# Patient Record
Sex: Male | Born: 1965 | Race: Black or African American | Hispanic: No | Smoking: Current some day smoker
Health system: Southern US, Community
[De-identification: ages and names within clinical notes are randomized; demographics above are authoritative.]

## PROBLEM LIST (undated history)

## (undated) DIAGNOSIS — E669 Obesity, unspecified: Secondary | ICD-10-CM

## (undated) DIAGNOSIS — E785 Hyperlipidemia, unspecified: Secondary | ICD-10-CM

## (undated) DIAGNOSIS — E876 Hypokalemia: Secondary | ICD-10-CM

## (undated) DIAGNOSIS — I10 Essential (primary) hypertension: Secondary | ICD-10-CM

## (undated) DIAGNOSIS — R7309 Other abnormal glucose: Secondary | ICD-10-CM

## (undated) DIAGNOSIS — L0293 Carbuncle, unspecified: Secondary | ICD-10-CM

## (undated) HISTORY — DX: Essential (primary) hypertension: I10

## (undated) HISTORY — DX: Other abnormal glucose: R73.09

## (undated) HISTORY — DX: Obesity, unspecified: E66.9

## (undated) HISTORY — PX: OTHER SURGICAL HISTORY: SHX169

## (undated) HISTORY — DX: Hypokalemia: E87.6

## (undated) HISTORY — DX: Carbuncle, unspecified: L02.93

## (undated) HISTORY — DX: Hyperlipidemia, unspecified: E78.5

---

## 2004-06-06 ENCOUNTER — Encounter: Payer: Self-pay | Admitting: Internal Medicine

## 2004-06-06 LAB — CONVERTED CEMR LAB: PSA: 0.9 ng/mL

## 2004-07-01 ENCOUNTER — Ambulatory Visit: Payer: Self-pay | Admitting: Internal Medicine

## 2004-07-25 ENCOUNTER — Ambulatory Visit: Payer: Self-pay | Admitting: Endocrinology

## 2004-09-02 ENCOUNTER — Ambulatory Visit: Payer: Self-pay | Admitting: Internal Medicine

## 2004-11-17 ENCOUNTER — Encounter: Admission: RE | Admit: 2004-11-17 | Discharge: 2004-11-17 | Payer: Self-pay | Admitting: Specialist

## 2004-12-05 ENCOUNTER — Ambulatory Visit: Payer: Self-pay | Admitting: Internal Medicine

## 2005-02-26 ENCOUNTER — Ambulatory Visit: Payer: Self-pay | Admitting: Internal Medicine

## 2005-04-17 ENCOUNTER — Ambulatory Visit: Payer: Self-pay | Admitting: Internal Medicine

## 2005-06-09 ENCOUNTER — Emergency Department: Payer: Self-pay | Admitting: Unknown Physician Specialty

## 2005-06-09 ENCOUNTER — Other Ambulatory Visit: Payer: Self-pay

## 2005-06-23 ENCOUNTER — Ambulatory Visit: Payer: Self-pay | Admitting: Internal Medicine

## 2005-07-07 ENCOUNTER — Ambulatory Visit: Payer: Self-pay | Admitting: Internal Medicine

## 2005-07-08 ENCOUNTER — Ambulatory Visit: Payer: Self-pay

## 2006-01-13 ENCOUNTER — Ambulatory Visit: Payer: Self-pay | Admitting: Internal Medicine

## 2006-01-15 ENCOUNTER — Ambulatory Visit: Payer: Self-pay | Admitting: Internal Medicine

## 2006-04-13 ENCOUNTER — Ambulatory Visit: Payer: Self-pay | Admitting: Internal Medicine

## 2006-04-27 ENCOUNTER — Ambulatory Visit: Payer: Self-pay | Admitting: Internal Medicine

## 2006-07-26 ENCOUNTER — Ambulatory Visit: Payer: Self-pay | Admitting: Internal Medicine

## 2006-09-27 ENCOUNTER — Ambulatory Visit: Payer: Self-pay | Admitting: Internal Medicine

## 2006-10-04 ENCOUNTER — Ambulatory Visit: Payer: Self-pay | Admitting: Internal Medicine

## 2006-11-11 ENCOUNTER — Ambulatory Visit: Payer: Self-pay | Admitting: Internal Medicine

## 2007-02-22 ENCOUNTER — Ambulatory Visit: Payer: Self-pay | Admitting: Internal Medicine

## 2007-03-21 ENCOUNTER — Encounter: Payer: Self-pay | Admitting: Internal Medicine

## 2007-03-21 DIAGNOSIS — I10 Essential (primary) hypertension: Secondary | ICD-10-CM

## 2007-03-21 DIAGNOSIS — E785 Hyperlipidemia, unspecified: Secondary | ICD-10-CM

## 2007-03-21 DIAGNOSIS — K219 Gastro-esophageal reflux disease without esophagitis: Secondary | ICD-10-CM | POA: Insufficient documentation

## 2007-03-23 ENCOUNTER — Ambulatory Visit: Payer: Self-pay | Admitting: Cardiology

## 2007-04-11 ENCOUNTER — Ambulatory Visit: Payer: Self-pay

## 2007-04-28 ENCOUNTER — Ambulatory Visit: Payer: Self-pay | Admitting: Cardiology

## 2007-09-23 ENCOUNTER — Telehealth: Payer: Self-pay | Admitting: Internal Medicine

## 2007-09-24 ENCOUNTER — Ambulatory Visit: Payer: Self-pay | Admitting: Family Medicine

## 2007-09-24 DIAGNOSIS — L03319 Cellulitis of trunk, unspecified: Secondary | ICD-10-CM

## 2007-09-24 DIAGNOSIS — L02219 Cutaneous abscess of trunk, unspecified: Secondary | ICD-10-CM

## 2007-09-26 ENCOUNTER — Ambulatory Visit: Payer: Self-pay | Admitting: Internal Medicine

## 2008-09-05 ENCOUNTER — Encounter: Payer: Self-pay | Admitting: Internal Medicine

## 2008-09-25 ENCOUNTER — Ambulatory Visit: Payer: Self-pay | Admitting: Internal Medicine

## 2008-09-25 DIAGNOSIS — F17200 Nicotine dependence, unspecified, uncomplicated: Secondary | ICD-10-CM | POA: Insufficient documentation

## 2008-09-26 ENCOUNTER — Ambulatory Visit: Payer: Self-pay | Admitting: Internal Medicine

## 2008-09-26 LAB — CONVERTED CEMR LAB
BUN: 12 mg/dL (ref 6–23)
CO2: 30 meq/L (ref 19–32)
Calcium: 8.9 mg/dL (ref 8.4–10.5)
Chloride: 107 meq/L (ref 96–112)
Creatinine, Ser: 0.9 mg/dL (ref 0.4–1.5)
GFR calc non Af Amer: 97 mL/min
HDL: 44.3 mg/dL (ref >39.0)
Hgb A1c MFr Bld: 5.7 % (ref 4.6–6.0)
TSH: 0.73 microintl units/mL (ref 0.35–5.50)
VLDL: 16 mg/dL (ref 0–40)

## 2008-10-15 ENCOUNTER — Ambulatory Visit: Payer: Self-pay | Admitting: Internal Medicine

## 2008-10-15 ENCOUNTER — Ambulatory Visit: Payer: Self-pay | Admitting: Cardiology

## 2008-10-15 ENCOUNTER — Observation Stay (HOSPITAL_COMMUNITY): Admission: EM | Admit: 2008-10-15 | Discharge: 2008-10-16 | Payer: Self-pay | Admitting: Emergency Medicine

## 2008-10-15 ENCOUNTER — Ambulatory Visit: Payer: Self-pay | Admitting: Interventional Radiology

## 2008-10-15 DIAGNOSIS — R079 Chest pain, unspecified: Secondary | ICD-10-CM

## 2008-10-16 ENCOUNTER — Encounter: Payer: Self-pay | Admitting: Internal Medicine

## 2008-11-02 ENCOUNTER — Ambulatory Visit: Payer: Self-pay | Admitting: Internal Medicine

## 2008-11-02 DIAGNOSIS — M6282 Rhabdomyolysis: Secondary | ICD-10-CM | POA: Insufficient documentation

## 2008-11-04 ENCOUNTER — Telehealth: Payer: Self-pay | Admitting: Internal Medicine

## 2008-11-15 ENCOUNTER — Encounter (INDEPENDENT_AMBULATORY_CARE_PROVIDER_SITE_OTHER): Payer: Self-pay | Admitting: *Deleted

## 2008-12-28 ENCOUNTER — Ambulatory Visit: Payer: Self-pay | Admitting: Cardiology

## 2009-01-31 ENCOUNTER — Telehealth (INDEPENDENT_AMBULATORY_CARE_PROVIDER_SITE_OTHER): Payer: Self-pay | Admitting: *Deleted

## 2009-05-10 ENCOUNTER — Ambulatory Visit: Payer: Self-pay | Admitting: Internal Medicine

## 2009-05-10 DIAGNOSIS — N453 Epididymo-orchitis: Secondary | ICD-10-CM | POA: Insufficient documentation

## 2009-10-16 ENCOUNTER — Telehealth: Payer: Self-pay | Admitting: Internal Medicine

## 2009-11-13 ENCOUNTER — Ambulatory Visit: Payer: Self-pay | Admitting: Internal Medicine

## 2009-11-13 DIAGNOSIS — R7309 Other abnormal glucose: Secondary | ICD-10-CM

## 2009-11-13 LAB — CONVERTED CEMR LAB
CO2: 24 meq/L (ref 19–32)
Chloride: 103 meq/L (ref 96–112)
Creatinine, Ser: 0.92 mg/dL (ref 0.40–1.50)
Hgb A1c MFr Bld: 5.5 % (ref 4.6–6.1)
Sodium: 138 meq/L (ref 135–145)

## 2009-11-14 ENCOUNTER — Encounter: Payer: Self-pay | Admitting: Internal Medicine

## 2010-02-14 ENCOUNTER — Encounter: Payer: Self-pay | Admitting: Internal Medicine

## 2010-02-14 DIAGNOSIS — N509 Disorder of male genital organs, unspecified: Secondary | ICD-10-CM | POA: Insufficient documentation

## 2010-02-14 LAB — CONVERTED CEMR LAB
Glucose, Urine, Semiquant: NEGATIVE
Ketones, urine, test strip: NEGATIVE
Protein, U semiquant: 100
Urobilinogen, UA: 0.2

## 2010-02-17 ENCOUNTER — Ambulatory Visit: Payer: Self-pay | Admitting: Internal Medicine

## 2010-02-17 LAB — CONVERTED CEMR LAB
Calcium: 9 mg/dL (ref 8.4–10.5)
Creatinine, Ser: 0.9 mg/dL (ref 0.4–1.5)
GFR calc non Af Amer: 115.34 mL/min (ref >60)
Glucose, Bld: 117 mg/dL — ABNORMAL HIGH (ref 70–99)
Potassium: 4.2 meq/L (ref 3.5–5.1)
Sodium: 142 meq/L (ref 135–145)

## 2010-06-03 IMAGING — CR DG CHEST 2V
2 series · 2 of 2 positions shown · non-contrast
Comparison: 11/17/2004

CLINICAL DATA: Chest pain

CHEST - 2 VIEW

[w chest pa]
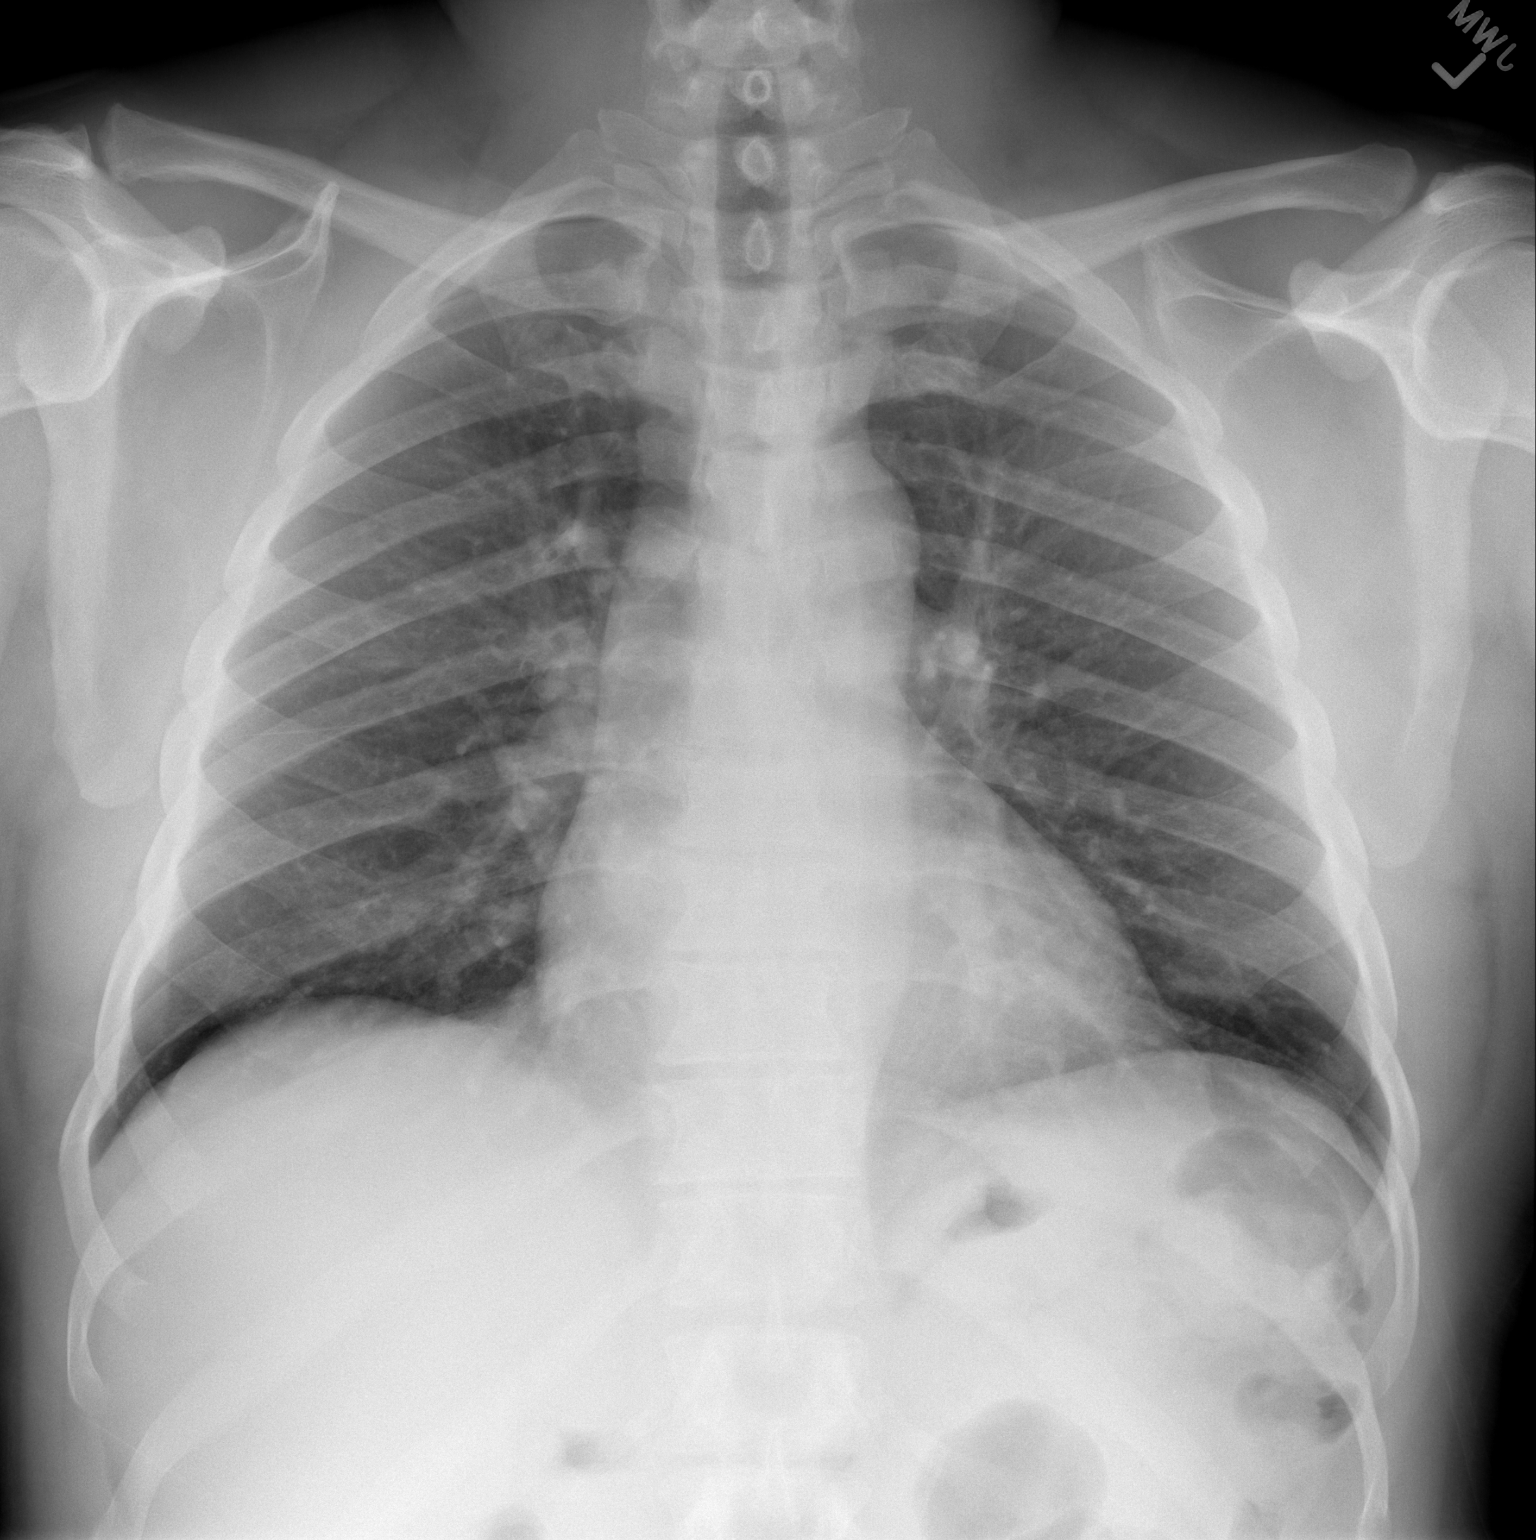

[w chest lat]
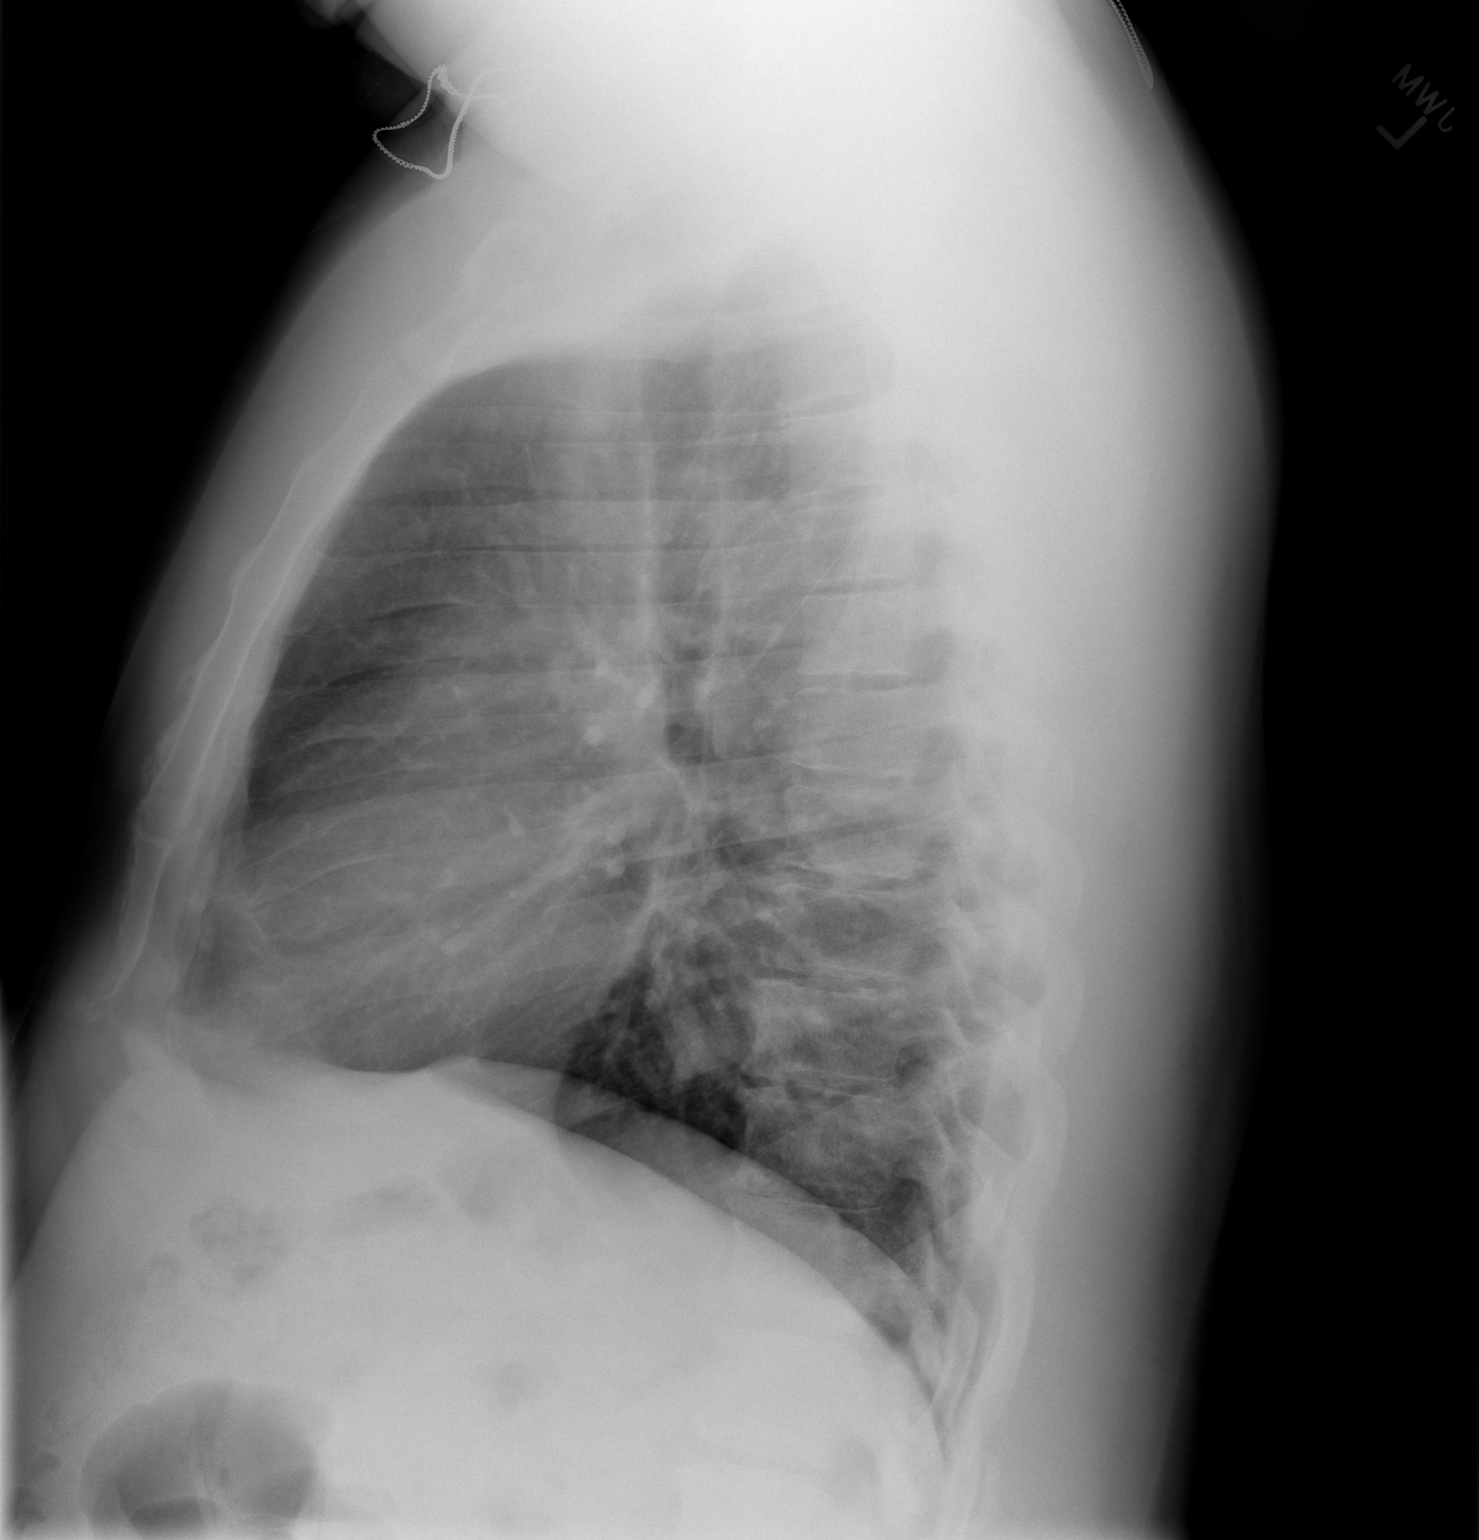

[2 of 2 positions shown; findings below may reference images not displayed]

FINDINGS: The heart size and mediastinal contours are within normal
limits.  Both lungs are clear.  The visualized skeletal structures
are unremarkable.
IMPRESSION: No active cardiopulmonary disease.

## 2010-08-11 ENCOUNTER — Ambulatory Visit: Payer: Self-pay | Admitting: Internal Medicine

## 2010-08-18 ENCOUNTER — Telehealth: Payer: Self-pay | Admitting: Internal Medicine

## 2010-09-28 LAB — CONVERTED CEMR LAB: Total CK: 314 units/L (ref 7–195)

## 2010-10-01 NOTE — Letter (Signed)
   [place] at [facility] [address] [place], [place]  [address]  [place] Phone: [phone]      February 17, 2010   [facility] [address] [place], [place] [address]  RE:  LAB RESULTS  Dear  [name],  The following is an interpretation of your most recent lab tests.  Please take note of any instructions provided or changes to medications that have resulted from your lab work.  ELECTROLYTES:  Good - no changes needed  KIDNEY FUNCTION TESTS:  Good - no changes needed          Sincerely Yours,    Dr. Thomos Lemons

## 2010-10-01 NOTE — Assessment & Plan Note (Signed)
Summary: blood pressure/mhf   Vital Signs:  Patient profile:   45 year old male Height:      70 inches Weight:      266 pounds BMI:     38.30 O2 Sat:      99 % on Room air Temp:     97.9 degrees F oral Pulse rate:   80 / minute Pulse rhythm:   regular Resp:     16 per minute BP sitting:   134 / 80  (right arm) Cuff size:   large  Vitals Entered By: Glendell Docker CMA (February 14, 2010 10:47 AM)  O2 Flow:  Room air CC: Rm 3- Follow up on Blood Pressure Is Patient Diabetic? No   Primary Care Provider:  DThomos Lemons DO  CC:  Rm 3- Follow up on Blood Pressure.  History of Present Illness: 45 y/o African male c/o left side shooting pain to groin area for the past 2 weeks no testicular trauma he has not noticed hernia, no fever or chills.  no urinary symptoms  Htn  - Azor is causing headaches daily .  lasting all day  Preventive Screening-Counseling & Management  Alcohol-Tobacco     Smoking Status: current  Allergies: 1)  ! Lisinopril (Lisinopril)  Past History:  Past Medical History: GERD Hyperlipidemia  Hypertension Tobacco Abuse   Atypical chest pain Myoview 08/08 -  EF 59%  question mild inferior ischemia (suspect bowel activity)      Family History: Family History of CAD Male 1st degree relative <60  Mother deceased with an MI.  Father deceased from   unknown cause.  He has siblings, but he has no idea of their health  status.   All of his family is in [place].       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   Lungs:  normal respiratory effort and normal breath sounds.   Heart:  normal rate, regular rhythm, and no gallop.   Genitalia:  left testicular tenderness,  no mass,  no cutaneous lesions and no urethral discharge.     Impression & Recommendations:  Problem # 1:  TESTICULAR PAIN, LEFT  (ICD-608.9) 45 y/o with left testicular tenderness consistent with epiditymitis.  tx with doxy and ceftin.  Patient advised to call office if symptoms persist or worsen.  Orders: UA Dipstick w/o Micro (manual) ([address])  Problem # 2:  HYPERTENSION (ICD-401.9) headaches with azor.  change to losartan and hctz  The following medications were removed from the medication list:    Azor 5-20 Mg Tabs (Amlodipine-olmesartan) ..... One by mouth once daily His updated medication list for this problem includes:    Losartan Potassium 100 Mg Tabs (Losartan potassium) ..... One by mouth qam    Hydrochlorothiazide 25 Mg Tabs (Hydrochlorothiazide) ..... One by mouth once daily  BP today: 134/80 Prior BP: 140/90 (11/13/2009)  Labs Reviewed: K+: 3.9 (11/13/2009) Creat: : 0.92 (11/13/2009)   Chol: 154 (09/26/2008)   HDL: 44.3 (09/26/2008)   LDL: 94 (09/26/2008)   TG: 81 (09/26/2008)  Complete Medication List: 1)  Doxycycline Hyclate 100 Mg Tabs (Doxycycline hyclate) .... One by mouth two times a day 2)  Losartan Potassium 100 Mg Tabs (Losartan potassium) .... One by mouth qam 3)  Hydrochlorothiazide 25 Mg Tabs (Hydrochlorothiazide) .... One by mouth once daily 4)  Cefuroxime Axetil 500 Mg Tabs (  Cefuroxime axetil) .... One by mouth two times a day  Patient Instructions: 1)  Please schedule a follow-up appointment in 6 months. 2)  BMET:  401.9  3)  schedule blood work within one month 4)  Call our office if your testicular pain does not get better Prescriptions: CEFUROXIME AXETIL 500 MG TABS (CEFUROXIME AXETIL) one by mouth two times a day  #20 x 0   Entered and Authorized by:   [name] DO   Signed by:   [name] DO on 02/14/2010   Method used:   Electronically to        [facility] [address]       [address], [address]       [place], [place]  [address]       Ph: [phone]       Fax: [phone]   RxID:    [id number] HYDROCHLOROTHIAZIDE 25 MG TABS (HYDROCHLOROTHIAZIDE) one by mouth once daily  #30 x 5   Entered and Authorized by:   [name] DO   Signed by:   [name] DO on 02/14/2010   Method used:   Electronically to        [facility] [address] (retail)       [address], [address]       [place], [place]  [address]       Ph: [phone]       Fax: [phone]   RxID:   [id number] LOSARTAN POTASSIUM 100 MG TABS (LOSARTAN POTASSIUM) one by mouth qam  #30 x 5   Entered and Authorized by:   [name] DO   Signed by:   [name] DO on 02/14/2010   Method used:   Electronically to        [facility] [address] ([facility])       [address], [address]       [place], [place]  [address]       Ph: [phone]       Fax: [phone]   RxID:   [id number] DOXYCYCLINE HYCLATE 100 MG TABS (DOXYCYCLINE HYCLATE) one by mouth two times a day  #20 x 0   Entered and Authorized by:   [name] DO   Signed by:   [name] DO on 02/14/2010   Method used:   Electronically to        [facility] [address])       [address], [address]       [place], [place]  [address]       Ph: [phone]       Fax: [phone]   RxID:   [id number]   Laboratory Results   Urine Tests    Routine Urinalysis   Color: orange Appearance: Clear Glucose: negative   (Normal Range: Negative) Bilirubin: negative   (Normal Range: Negative) Ketone: negative   (Normal Range: Negative) Spec. Gravity: >=1.030   (Normal Range: 1.003-1.035) Blood: small   (Normal Range: Negative) pH: 5.0   (Normal Range: 5.0-8.0) Protein: 100   (Normal Range: Negative) Urobilinogen: 0.2   (Normal Range: 0-1) Nitrite: negative   (Normal Range: Negative) Leukocyte Esterace: negative   (Normal Range: Negative)

## 2010-10-01 NOTE — Assessment & Plan Note (Signed)
Summary: BLOOD PRESSURE/MHF   Vital Signs:  Patient profile:   45 year old male Height:      70 inches Weight:      274 pounds BMI:     39.46 O2 Sat:      98 % on Room air Temp:     97.7 degrees F oral Pulse rate:   72 / minute Pulse rhythm:   regular Resp:     16 per minute BP sitting:   140 / 90  (left arm) Cuff size:   large  Vitals Entered By: Glendell Docker CMA (November 13, 2009 11:21 AM)  O2 Flow:  Room air CC: Rm 2- Follow up On Blood Pressure   Primary Care Provider:  Dondra Spry DO  CC:  Rm 2- Follow up On Blood Pressure.  History of Present Illness:  Hypertension Follow-Up      This is a 45 year old man who presents for Hypertension follow-up.  The patient denies lightheadedness and edema.  The patient denies the following associated symptoms: chest pain and chest pressure.  Compliance with medications (by patient report) has been near 100%.  The patient reports that dietary compliance has been fair and poor.  The patient reports no exercise.    Allergies: 1)  ! Lisinopril (Lisinopril)  Past History:  Past Medical History: GERD Hyperlipidemia  Hypertension Tobacco Abuse  Atypical chest pain Myoview 08/08 -  EF 59%  question mild inferior ischemia (suspect bowel activity)      Family History: Family History of CAD Male 1st degree relative <60  Mother deceased with an MI.  Father deceased from   unknown cause.  He has siblings, but he has no idea of their health  status.   All of his family is in [place].      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   Neck:  No deformities, masses, or tenderness noted.no carotid bruits.   Lungs:  normal respiratory effort and normal breath sounds.   Heart:  normal rate, regular rhythm, and no gallop.   Abdomen:  soft, non-tender, and normal bowel sounds.   Extremities:   trace left pedal edema and trace right pedal edema.     Impression & Recommendations:  Problem # 1:  HYPERTENSION (ICD-401.9) Pt c/o urinary freq.  He is truck Hospital doctor.  switch to azor.  The following medications were removed from the medication list:    Diovan Hct 160-25 Mg Tabs (Valsartan-hydrochlorothiazide) .Marland Kitchen... Take 1 tablet by mouth once a day His updated medication list for this problem includes:    Azor 5-20 Mg Tabs (Amlodipine-olmesartan) ..... One by mouth once daily  Orders: T-Basic Metabolic Panel [phone]) T- Hemoglobin A1C ([address]14[address])  BP today: 140/90 Prior BP: 138/80 (05/10/2009)  Labs Reviewed: K+: 3.8 (09/26/2008) Creat: : 0.9 (09/26/2008)   Chol: 154 (09/26/2008)   HDL: 44.3 (09/26/2008)   LDL: 94 (09/26/2008)   TG: 81 (09/26/2008)  Problem # 2:  HYPERGLYCEMIA (ICD-790.29) monitor a1c.  pt counseled on diet and exercise Labs Reviewed: Creat: 0.9 (09/26/2008)     Complete Medication List: 1)  Azor 5-20 Mg Tabs (Amlodipine-olmesartan) .... One by mouth once daily  Patient Instructions: 1)  Please schedule a follow-up appointment in 6 months. Prescriptions: AZOR 5-20 MG TABS (AMLODIPINE-OLMESARTAN) one by mouth once daily  #90 x 3   Entered and Authorized by:  Dondra Spry DO   Signed by:   [name] DO on 11/13/2009   Method used:   Print then Give to Patient   RxID:   [id number]   Current Allergies (reviewed today): ! LISINOPRIL (LISINOPRIL)

## 2010-10-01 NOTE — Progress Notes (Signed)
Summary: Out of State Diovan Refill   Phone Note Refill Request Message from:  Pharmacy on October 16, 2009 2:45 PM  Refills Requested: Medication #1:  DIOVAN HCT 160-25 MG  TABS Take 1 tablet by mouth once a day   Dosage confirmed as above?Dosage Confirmed   Brand Name Necessary? No   Supply Requested: 1 month [facility] phone # [phone] fax # [phone]   Method Requested: Electronic Next Appointment Scheduled: none Initial call taken by: Roselle Locus,  October 16, 2009 2:46 PM  Follow-up for Phone Call        ok to send refill x 2 Follow-up by: [name] DO,  October 16, 2009 6:37 PM  Additional Follow-up for Phone Call Additional follow up Details #1::        Rx called to pharmacy Additional Follow-up by: Glendell Docker CMA,  October 17, 2009 3:24 PM

## 2010-10-01 NOTE — Letter (Signed)
   [place] at [facility] [address] [place], [place]  [address]  [place] Phone: [phone]      November 14, 2009   [facility] [address] [place], [place] [address]  RE:  LAB RESULTS  Dear  Mr. ADELSON,  The following is an interpretation of your most recent lab tests.  Please take note of any instructions provided or changes to medications that have resulted from your lab work.  ELECTROLYTES:  Good - no changes needed  KIDNEY FUNCTION TESTS:  Good - no changes needed     Diabetes blood test  - normal       Sincerely Yours,    Dr. Thomos Lemons

## 2010-10-02 NOTE — Progress Notes (Signed)
Summary: Medication Refill  Phone Note Call from Patient Call back at [phone]   Caller: Patient Call For: [name] DO Summary of Call: patient caled requesting a refill on his blood pressure medication. He states that he will be leaving for [place] this morning.  Per Dr Artist Pais approval patient was informed a 90 supply of his medication has been sent to [facility]. He waas informed he would need to return for blood work and follow up office visit with Dr Artist Pais. Patient verbalized understanding and agrees. Initial call taken by: Glendell Docker CMA,  August 18, 2010 8:11 AM    Prescriptions: HYDROCHLOROTHIAZIDE 25 MG TABS (HYDROCHLOROTHIAZIDE) one by mouth once daily  #90 x 0   Entered by:   Glendell Docker CMA   Authorized by:   [name] DO   Signed by:   Glendell Docker CMA on 08/18/2010   Method used:   Electronically to        [facility] [address])       [address], [address]       [place], [place]  [address]       Ph: [phone]       Fax: [phone]   RxID:   [id number] LOSARTAN POTASSIUM 100 MG TABS (LOSARTAN POTASSIUM) one by mouth qam  #90 x 0   Entered by:   Glendell Docker CMA   Authorized by:   [name] DO   Signed by:   Glendell Docker CMA on 08/18/2010   Method used:   Electronically to        [facility] [address]       [address], [address]       [place], [place]  [address]       Ph: [phone]       Fax: [phone]   RxID:   [id number]

## 2010-11-04 ENCOUNTER — Telehealth: Payer: Self-pay | Admitting: Internal Medicine

## 2010-11-05 ENCOUNTER — Telehealth: Payer: Self-pay | Admitting: Internal Medicine

## 2010-11-11 NOTE — Progress Notes (Signed)
Summary: refill-hydrochlorothi and losartan  Phone Note Refill Request Message from:  Fax from Pharmacy on November 05, 2010 9:47 AM  Refills Requested: Medication #1:  losartan 100mg  take one tablet by mouth in the morning   Brand Name Necessary? No   Supply Requested: 3 months   Last Refilled: 08/18/2010  Medication #2:  HYDROCHLOROTHIAZIDE 25 MG TABS one by mouth once daily   Dosage confirmed as above?Dosage Confirmed   Brand Name Necessary? No   Supply Requested: 3 months   Last Refilled: 08/18/[facility] [address] Chrissie Noa [place], [place] [address] fax [phone]   Method Requested: Electronic Next Appointment Scheduled: 3.15.12 Siyona Coto Initial call taken by: Elba Barman,  November 05, 2010 9:49 AM  Follow-up for Phone Call        Printed rx from 11/04/2010 have been faxed to  [phone] Follow-up by: Glendell Docker CMA,  November 05, 2010 10:14 AM

## 2010-11-11 NOTE — Progress Notes (Signed)
Summary: Blood Pressure Medication  Phone Note Call from Patient Call back at [phone]   Caller: Patient Call For: [name] DO Summary of Call: patient called and left voice message requesting refill on Blood pressure medication. He states that he is out  Initial call taken by: Glendell Docker CMA,  November 04, 2010 4:10 PM  Follow-up for Phone Call        Pt called back    pharmacy [facility] ,[facility], I spoke with pharmacist ,he will fax refill request  Follow-up by: Darral Dash,  November 04, 2010 4:57 PM  Additional Follow-up for Phone Call Additional follow up Details #1::        Spoke to Gray at [place], [place] [phone] and gave verbal auth for Losartan Potassium and HCTZ for # 30 on each med x no refills.  Pt notified. Nicki Guadalajara Fergerson CMA Duncan Dull)  November 04, 2010 5:18 PM     Prescriptions: HYDROCHLOROTHIAZIDE 25 MG TABS (HYDROCHLOROTHIAZIDE) one by mouth once daily  #30 x 0   Entered by:   Mervin Kung CMA (AAMA)   Authorized by:   [name] DO   Signed by:   Mervin Kung CMA (AAMA) on 11/04/2010   Method used:   Print then Give to Patient   RxID:   [id number] LOSARTAN POTASSIUM 100 MG TABS (LOSARTAN POTASSIUM) one by mouth qam  #30 x 0   Entered by:   Mervin Kung CMA (AAMA)   Authorized by:   [name] DO   Signed by:   Mervin Kung CMA (AAMA) on 11/04/2010   Method used:   Print then Give to Patient   RxID:   [id number]

## 2010-11-13 ENCOUNTER — Encounter: Payer: Self-pay | Admitting: Internal Medicine

## 2010-11-13 ENCOUNTER — Ambulatory Visit (INDEPENDENT_AMBULATORY_CARE_PROVIDER_SITE_OTHER): Payer: PRIVATE HEALTH INSURANCE | Admitting: Internal Medicine

## 2010-11-13 DIAGNOSIS — R7309 Other abnormal glucose: Secondary | ICD-10-CM

## 2010-11-13 DIAGNOSIS — I10 Essential (primary) hypertension: Secondary | ICD-10-CM

## 2010-12-02 NOTE — Assessment & Plan Note (Signed)
Summary: MED REFILL/MHF   Vital Signs:  Patient profile:   45 year old male Height:      70 inches Weight:      271.75 pounds BMI:     39.13 O2 Sat:      97 % on Room air Temp:     97.9 degrees F oral Pulse rate:   88 / minute Resp:     18 per minute BP sitting:   134 / 80  (left arm) Cuff size:   large  Vitals Entered By: Glendell Docker CMA (November 13, 2010 3:52 PM)  O2 Flow:  Room air CC: Medication follow up  Is Patient Diabetic? No Pain Assessment Patient in pain? no      Comments will be traveling to [place] in May has questions  about travel vaccines   Primary Care Provider:  [name] DO  CC:  Medication follow up .  History of Present Illness:  Hypertension Follow-Up      This is a 45 year old man who presents for Hypertension follow-up.  The patient reports edema, but denies lightheadedness and impotence.  The patient denies the following associated symptoms: chest pain.  Compliance with medications (by patient report) has been near 100%.  The patient reports that dietary compliance has been fair.    Preventive Screening-Counseling & Management  Alcohol-Tobacco     Smoking Status: current     Cigars/week: 1  Allergies: 1)  ! 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   Neck:  No deformities, masses, or tenderness noted.no carotid bruits.   Lungs:  normal respiratory effort and normal breath sounds.   Heart:  normal rate, regular rhythm, and no gallop.   Extremities:  trace left pedal edema and trace right pedal edema.     Impression &  Recommendations:  Problem # 1:  HYPERTENSION (ICD-401.9) Assessment Unchanged  His updated medication list for this problem includes:    Losartan Potassium 100 Mg Tabs (Losartan potassium) ..... One by mouth qam    Hydrochlorothiazide 25 Mg Tabs (Hydrochlorothiazide) ..... One by mouth once daily  Orders: T-Basic Metabolic Panel [phone])  BP today: 134/80 Prior BP: 134/80 (02/14/2010)  Labs Reviewed: K+: 4.2 (02/17/2010) Creat: : 0.9 (02/17/2010)   Chol: 154 (09/26/2008)   HDL: 44.3 (09/26/2008)   LDL: 94 (09/26/2008)   TG: 81 (09/26/2008)  Problem # 2:  HYPERGLYCEMIA (ICD-790.29)  Labs Reviewed: Creat: 0.9 (02/17/2010)     Complete Medication List: 1)  Losartan Potassium 100 Mg Tabs (Losartan potassium) .... One by mouth qam 2)  Hydrochlorothiazide 25 Mg Tabs (Hydrochlorothiazide) .... One by mouth once daily  Patient Instructions: 1)  Please schedule a follow-up appointment in 1 year. Prescriptions: HYDROCHLOROTHIAZIDE 25 MG TABS (HYDROCHLOROTHIAZIDE) one by mouth once daily  #90 x 3   Entered and Authorized by:   [name] DO   Signed by:   [name] DO on 11/13/2010   Method used:   Print then Give to Patient   RxID:   [id number] LOSARTAN POTASSIUM 100 MG TABS (LOSARTAN POTASSIUM) one by mouth qam  #90 x  3   Entered and Authorized by:   [name] DO   Signed by:   [name] DO on 11/13/2010   Method used:   Print then Give to Patient   RxID:   [id number]    Orders Added: 1)  T-Basic Metabolic Panel [[id number]] 2)  Est. Patient Level III [33295]    Current Allergies (reviewed today): ! LISINOPRIL (LISINOPRIL)

## 2010-12-16 LAB — PROTIME-INR: Prothrombin Time: 12.7 seconds (ref 11.6–15.2)

## 2010-12-16 LAB — CBC
Hemoglobin: 14.4 g/dL (ref 13.0–17.0)
MCHC: 33.5 g/dL (ref 30.0–36.0)
MCV: 83.9 fL (ref 78.0–100.0)
Platelets: 256 10*3/uL (ref 150–400)
RBC: 5.12 MIL/uL (ref 4.22–5.81)
RDW: 13.3 % (ref 11.5–15.5)
WBC: 5.3 10*3/uL (ref 4.0–10.5)

## 2010-12-16 LAB — BASIC METABOLIC PANEL
Calcium: 9.1 mg/dL (ref 8.4–10.5)
Chloride: 103 mEq/L (ref 96–112)
GFR calc non Af Amer: >60 mL/min (ref >60)
Potassium: 3.9 mEq/L (ref 3.5–5.1)

## 2010-12-16 LAB — CARDIAC PANEL(CRET KIN+CKTOT+MB+TROPI)
CK, MB: 2.9 ng/mL (ref 0.3–4.0)
CK, MB: 3.2 ng/mL (ref 0.3–4.0)
Relative Index: 0.6 (ref 0.0–2.5)
Relative Index: 0.6 (ref 0.0–2.5)
Troponin I: <0.01 ng/mL (ref 0.00–0.06)

## 2010-12-16 LAB — DIFFERENTIAL
Eosinophils Absolute: 0.1 10*3/uL (ref 0.0–0.7)
Lymphs Abs: 1.9 10*3/uL (ref 0.7–4.0)

## 2010-12-16 LAB — TROPONIN I: Troponin I: <0.01 ng/mL (ref 0.00–0.06)

## 2010-12-16 LAB — CK TOTAL AND CKMB (NOT AT ARMC): Relative Index: 0.7 (ref 0.0–2.5)

## 2010-12-16 LAB — D-DIMER, QUANTITATIVE: D-Dimer, Quant: 0.59 ug/mL-FEU — ABNORMAL HIGH (ref 0.00–0.48)

## 2011-01-13 NOTE — Assessment & Plan Note (Signed)
[facility]                            CARDIOLOGY OFFICE NOTE   RECTOR, DEVONSHIRE                          MRN:          [id number]  DATE:03/23/2007                            DOB:          1966/06/06    Mr. Crotty is a 45 year old male who I am asked to consult on concerning  chest pain. He has no prior cardiac history by his report. Note his  English is not great. He did have a Myoview performed on July 08, 2005. At that time the patient exercised for a duration of 9 minutes and  there were no ST changes. His perfusion images were felt to be normal.  The patient states that he occasionally has pain in his left shoulder  and arm area. This is predominantly with use. It is not pleuritic, or  positional, nor is it related to food. It is not exertional. There is no  associated nausea, vomiting, shortness of breath, or diaphoresis. He has  had this intermittently for 3 months. Note he does not have exertional  chest pain. He denies any dyspnea on exertion, orthopnea, PND, pedal  edema, or syncope.   His medications include;  1. Diovan/hydrochlorothiazide 160/25 mg tablets 1 p.o. daily.  2. Zocor 40 mg p.o. daily.   HE HAS AN INTOLERANCE TO ACE INHIBITOR SECONDARY TO COUGHING.   FAMILY HISTORY:  Positive for coronary artery disease as his mother had  a myocardial infarction by his report at age 68.   SOCIAL HISTORY:  He smokes cigars. He occasionally consumes alcohol.   PAST MEDICAL HISTORY:  Significant for hypertension, hyperlipidemia,  there is no diabetes mellitus. He has a history of peptic ulcer disease.  He also has a history of lower back pain. He has had no previous  surgeries.   REVIEW OF SYSTEMS:  He denies any headaches, fevers, chills. There is an  occasional cough but it is nonproductive and there is no hemoptysis.  There is no dysphagia, odynophagia, melena, or hematochezia. There is no  dysuria, hematuria. Denies any seizure  activity. There is no orthopnea,  PND, or pedal edema. There is no claudication noted. The remainder of  the systems are negative.   PHYSICAL EXAMINATION:  Today shows a blood pressure of 140/92 and his  pulse 88. He weight 276 pounds. He is well-developed, somewhat obese. He  is in no acute distress.  SKIN: Warm and dry.  He does not appear to be depressed.  There is no peripheral clubbing.  His back is normal.  HEENT: Normal with normal eyelids.  NECK: Supple with a normal upstroke bilaterally.  There are no bruits  noted. There is no jugular venous distension and no thyromegaly is  noted.  CHEST: Clear to auscultation, normal expansion.  CARDIOVASCULAR EXAM: Reveals a regular rate and rhythm. Normal S1, S2.  There are no murmurs, rubs, or gallops noted.  ABDOMINAL EXAM: Not tender or distended.  Positive bowel sounds. No  hepatosplenomegaly. No masses appreciated. There is no abdominal bruits.  He has 2 + femoral pulses bilaterally. No bruits.  EXTREMITIES: Show  no edema and I could palpate no cords. He has 2 +  posterior tibial pulses bilaterally. He has some scarring on his left  lower extremity from an accident as a child.  NEUROLOGICAL EXAM: Grossly intact.   Electrocardiogram shows a sinus rhythm at a rate of 83. 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 A prior septal infarct can not be excluded.  There is left ventricular hypertrophy. 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   DIAGNOSES:  1. Atypical chest pain- the patient's symptoms are very atypical and      sound to possibly be musculoskeletal in etiology. However, he has      hypertension, hyperlipidemia, and a strong family history. His      electrocardiogram also shows a question of a prior inferior and      septal infarct. We will plan to check an echocardiogram to quantify      his left ventricular function and wall motion. We will also      schedule him to have a stress Myoview. If these are  unremarkable      then we will not pursue further cardiac workup. If they are      abnormal then he may require cardiac catheterization.  2. Abnormal electrocardiogram- as per #1.  3. Hypertension- his blood pressure is mildly elevated today. This      will need to be tracked and his Diovan could be increased as      needed.  4. Hyperlipidemia- Dr. Artist Pais is following this.  5. Tobacco abuse- the patient needs to discontinue his smoking.   We will see him back on an as needed basis pending the results of his  echocardiogram and Myoview.     Madolyn Frieze Jens Som, MD, [facility]  Electronically Signed    BSC/MedQ  DD: 03/23/2007  DT: 03/23/2007  Job #: [id number]   cc:   Barbette Hair. Artist Pais, DO

## 2011-01-13 NOTE — Assessment & Plan Note (Signed)
[facility]                            CARDIOLOGY OFFICE NOTE   [name], [name]                          MRN:          [id number]  DATE:04/28/2007                            DOB:          [birth year]    [name] is a 45 year old gentleman with hypertension and  hyperlipidemia who I was recently asked to evaluate for atypical chest  pain.  We scheduled an echocardiogram on April 11, 2007.  His LV  function was normal.  There was trivial mitral regurgitation.  There was  trivial tricuspid regurgitation.  We also scheduled a Myoview which was  performed on April 11, 2007.  The patient's ejection fraction was 59%.  There was bowel activity making the inferior wall difficult to evaluate  but there appeared to be mild ischemia in the inferior septal wall.  It  was felt to be a low risk study.  Note, the patient did exercise for 9  minutes and there were no ST changes.  Since that time he has had no  further chest pain and there is no dyspnea.   MEDICATIONS:  1. Diovan HCT 160/25 mg tablets one p.o. daily.  2. Zocor 40 mg p.o. daily.   PHYSICAL EXAMINATION:  Shows a blood pressure of 142/87 and his pulse is  88.  HEENT:  Normal.  NECK:  Supple.  CHEST:  Clear.  CARDIOVASCULAR:  Regular.  ABDOMINAL:  Benign.  EXTREMITIES:  Show no edema.   DIAGNOSES:  1. Mildly abnormal nuclear study -- I have reviewed the images with      [name].  I think his previous chest pain was atypical and      unlikely to be cardiac.  His stress Myoview appears to be low risk      and he has had no symptoms since I saw him previously.  We will      therefore plan medical therapy and plan to repeat his Myoview in 1      year.  We could certainly re-assess this sooner if he develops      recurrent symptoms.  He understands this and is agreeable.  We will      continue with his statin and his angiotensin reception blocker but      we will also add aspirin 81 mg p.o.  daily.  2. Hypertension -- His blood pressure is mildly elevated today he will      follow up with Dr. Artist Pais concerning this issue.  3. Hyperlipidemia -- Dr. Artist Pais is following this as well.  4. Tobacco abuse -- We discussed the importance of discontinuing this.      Will see him back in 6 months.    Madolyn Frieze Jens Som, MD, [facility]  Electronically Signed   BSC/MedQ  DD: 04/28/2007  DT: 04/29/2007  Job #: [id number]   cc:   Barbette Hair. Artist Pais, DO

## 2011-01-13 NOTE — Consult Note (Signed)
NAMESIMS, [name]                 ACCOUNT NO.:  [id number]   MEDICAL RECORD NO.:  [id number]          PATIENT TYPE:  OBV   LOCATION:  4710                         FACILITY:  [facility]   PHYSICIAN:  Thomas C. Wall, MD, FACCDATE OF BIRTH:  Mar 19, 1966   DATE OF CONSULTATION:  10/15/2008  DATE OF DISCHARGE:                                 CONSULTATION   PRIMARY CARDIOLOGIST:  Madolyn Frieze. Jens Som, MD, [facility].   PRIMARY CARE PHYSICIAN:  Barbette Hair. Artist Pais, DO   REASON FOR CONSULTATION:  Chest pain.   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  He discussed that he has a  pinching pain on the left side of the chest lasting seconds occurring  once a day.  Yesterday, however, it occurred three times.  It was  described as a quick pinch; however, the patient yesterday felt also  some gas in his chest, which he feels chronically that it occurred with  the pinching.  He saw Dr. Artist Pais and also complained of some pain radiating  down to the left arm from the shoulder to the elbow occurring yesterday  as well.  The patient had no other symptoms after being seen at Dr.  Olegario Messier office with multiple cardiovascular risk factors that you felt, it  would be best to have him admitted to have further evaluation.  Of note,  the patient was seen by Dr. Olga Millers and a stress Myoview was done  in August 2008 revealing mild inferior septal wall ischemia.  However,  he also had some bowel attenuation and it was deemed a low-risk study at  that time.   REVIEW OF SYSTEMS:  Positive for chest pain described as pinching pain  and achiness in the left arm radiating to the elbow and some gas-like  pain, which he has chronically.  All other systems are reviewed and  found to be negative.   PAST MEDICAL HISTORY:  1. Hypertension.  2. Hyperlipidemia.  3. Tobacco abuse.  4.  Peptic ulcer disease.  5. Chronic lower back pain.   PAST SURGICAL HISTORY:  None.   SOCIAL HISTORY:  The patient lives in [place].  He is a widower.  He  lives alone.  He is a Naval architect.  He has two children who live with  other family.  He smokes cigars and drinks alcohol occasionally.  No  drug use.   FAMILY HISTORY:  Mother deceased with an MI.  Father deceased from  unknown cause.  He has siblings, but he has no idea of their health  status.  All of his family is in [place].   CURRENT MEDICATIONS:  1. Diovan 160/25 mg daily.  2. Zocor 40 mg nightly.  3. Protonix 40 mg daily.  4. Aspirin 81 mg daily.   ALLERGIES:  No known drug allergies.   CURRENT LABORATORIES:  Sodium 138, potassium 3.9, chloride 103, CO2 of  27, BUN 13, creatinine 0.90, glucose 103.  Hemoglobin 14.4, hematocrit  43.0, white blood cells 5.3, platelets 256.  CK 624, MB 4.3, troponin  less than 0.01.  PT 12.7, INR 0.9.  Chest x-ray revealing no active  cardiopulmonary disease.  EKG reveals normal sinus rhythm and anterior  fascicular block, rate of 84 beats per minute.  No change from February  2008.   CARDIOVASCULAR RISK FACTORS:  Hypertension, hypercholesterolemia, family  history of tobacco use and obesity.   PHYSICAL EXAMINATION:  VITAL SIGNS:  Blood pressure 138/62, pulse 70,  respirations 18, temperature 97.9.  The patient weighs 266 pounds.  HEENT:  Head is normocephalic and atraumatic.  Eyes:  PERRLA.  Mucous  membranes of mouth pink and moist.  Tongue is midline.  NECK:  Supple.  There is no JVD.  There are no carotid bruits  appreciated.  CARDIOVASCULAR:  Regular rate and rhythm without murmurs, rubs, or  gallops.  Pulses are 2+ and equal without bruits.  LUNGS:  Clear to auscultation without wheezes, rales, or rhonchi.  ABDOMEN:  Soft and nontender with normal bowel sounds.  EXTREMITIES:  Without clubbing, cyanosis, or edema.  Pulses are 2+ and  equal bilaterally.  NEUROLOGIC:  Cranial  nerves II through XII are grossly intact.   IMPRESSION:  1. Chest pain with typical and atypical features described as pinching      with achiness in the left arm x1 week.  2. Hypertension.  3. Hypercholesterolemia.   PLAN:  A 45 year old African American male who presented to Dr. Olegario Messier  office with complaints of 1 week of pinching chest pain, occurring once  a day with the exception of yesterday having three episodes with left  arm pain and gas pain in his chest.  CK plus MB were mildly elevated.  Troponin is negative.  EKG is unchanged.  The patient has been seen and  examined by myself and Dr. Juanito Doom.  He does have multiple  cardiovascular risk factors, but the chest pain is atypical.  He is  nondiabetic.  Our plan will be to check cardiac enzymes as Dr. Artist Pais has  already ordered and continue medications.  If cardiac enzymes are  negative, we would repeat stress Myoview.      Bettey Mare. Lyman Bishop, NP      Jesse Sans. Daleen Squibb, MD, [facility]  Electronically Signed    KML/MEDQ  D:  10/15/2008  T:  10/16/2008  Job:  [id number]   cc:   Barbette Hair. Artist Pais, DO

## 2011-01-16 NOTE — Discharge Summary (Signed)
[name], [name]                 ACCOUNT NO.:  [id number]   MEDICAL RECORD NO.:  [id number]          PATIENT TYPE:  OBV   LOCATION:  4710                         FACILITY:  [facility]   PHYSICIAN:  Valerie A. Felicity Coyer, MDDATE OF BIRTH:  [birth year]   DATE OF ADMISSION:  10/15/2008  DATE OF DISCHARGE:  10/16/2008                               DISCHARGE SUMMARY   DISCHARGE DIAGNOSES:  1. Chest pain, atypical for angina, status post cardiology evaluation,      rule out acute coronary syndrome negative, rule out pulmonary      embolism negative, stable for discharge home to perform outpatient      Myoview at the direction of cardiology.  2. Hypertension, well controlled, continue home medications.  3. Dyslipidemia, holding statin due to elevated CK, outpatient      followup with primary MD.  4. Chronic low back pain, question right lower extremity      radiculopathy, outpatient followup with primary care physician.   DISCHARGE MEDICATIONS:  The patient to hold Zocor until follow up with  Dr. Artist Pais.  Other medications as before which include,  1. Diovan HCT 160/25 once p.o. daily.  2. Protonix 40 mg daily.  3. Aspirin 81 mg once daily.   HOSPITAL FOLLOWUP:  Dr. Thomos Lemons for Thursday, October 25, 2008, at  11:30 a.m., follow up on these chronic issues as well as other medical  concerns.   CONSULTS THIS HOSPITALIZATION:  [facility], Dr. Daleen Squibb and Dr.  Myrtis Ser on October 15, 2008.   PROCEDURES:  A 2-D echo with normal LV function as well as CT of the  chest rule out PE negative.   HOSPITAL COURSE BY PROBLEM:  Chest pain.  The patient is a 45 year old  gentleman from [place], who presented to his primary care physician  with complaints of chest pain radiating down left arm, due to history of  hypertension and dyslipidemia who is in the profession of truck driving.  He was admitted for further evaluation to rule out cardiac abnormality.  On telemetry, there were no arrhythmias.   Serial cardiac enzymes were  negative, and because of an elevated D-dimer, a CT chest was performed  which ruled out PE.  The patient had no further exacerbation of pain  symptoms during this hospitalization, and after cardiology evaluation,  it was felt that his pain was unlikely cardiac in nature.  It felt to be  radicular.  He should have outpatient followup as appropriate, but  otherwise is to continue therapy for reflux and musculoskeletal therapy.  Because of an elevated CK in the 100s, his Zocor was held, but there was  no specific evidence of rhabdo.  Recommend resuming statin for control  of his dyslipidemia as is felt appropriate by his primary MD, pending  followup.     Valerie A. Felicity Coyer, MD  Electronically Signed    VAL/MEDQ  D:  11/08/2008  T:  11/09/2008  Job:  [id number]

## 2011-04-24 ENCOUNTER — Encounter: Payer: Self-pay | Admitting: Family

## 2011-04-24 ENCOUNTER — Ambulatory Visit (INDEPENDENT_AMBULATORY_CARE_PROVIDER_SITE_OTHER): Payer: PRIVATE HEALTH INSURANCE | Admitting: Family

## 2011-04-24 VITALS — BP 130/96 | HR 84 | Temp 98.7°F | Resp 16 | Ht 70.0 in | Wt 273.1 lb

## 2011-04-24 DIAGNOSIS — S239XXA Sprain of unspecified parts of thorax, initial encounter: Secondary | ICD-10-CM

## 2011-04-24 DIAGNOSIS — I10 Essential (primary) hypertension: Secondary | ICD-10-CM

## 2011-04-24 DIAGNOSIS — S29019A Strain of muscle and tendon of unspecified wall of thorax, initial encounter: Secondary | ICD-10-CM | POA: Insufficient documentation

## 2011-04-24 MED ORDER — MELOXICAM 7.5 MG PO TABS: 7.5000 mg | ORAL_TABLET | Freq: Every day | ORAL | Status: DC

## 2011-04-24 NOTE — Assessment & Plan Note (Addendum)
BP Readings from Last 3 Encounters:  04/24/11 130/96  11/13/10 134/80  02/14/10 134/80   Check BMET today.  DBP above goal today, repeat at 1 month f/u.

## 2011-04-24 NOTE — Progress Notes (Signed)
  Subjective:    Patient ID: [name], male    DOB: 1966/03/31, 45 y.o.   MRN: [id number]  HPI  Ms.  Swayze is a 45 yr old male who presents today with chief complaint of back pain.  He reports some mid back pain for several months, which is occuring on a daily basis.  Pain is described as moderate.  He tried bengay without relief.  Pain is worsened by standing.  He reports some pain that radiates around the right hip.  Denies associated weaknes in the right leg.  Denies back problems.    Review of Systems See HPI  No past medical history on file.  History   Social History  . Marital Status: Single    Spouse Name: N/A    Number of Children: N/A  . Years of Education: N/A   Occupational History  . Not on file.   Social History Main Topics  . Smoking status: Current Some Day Smoker    Types: Cigars  . Smokeless tobacco: Never Used  . Alcohol Use: Yes     occasional  . Drug Use: Not on file  . Sexually Active: Not on file   Other Topics Concern  . Not on file   Social History Narrative   Regular exercise: noCaffeine Use: "drinks caffeinated drinks all day"    No past surgical history on file.  No family history on file.  Allergies  Allergen Reactions  . Lisinopril     No current outpatient prescriptions on file prior to visit.    BP 130/96  Pulse 84  Temp(Src) 98.7 F (37.1 C) (Oral)  Resp 16  Ht 5\' 10"  (1.778 m)  Wt 273 lb 1.3 oz (123.868 kg)  BMI 39.18 kg/m2       Objective:   Physical Exam  Constitutional: He appears well-developed and well-nourished.  HENT:  Head: Normocephalic and atraumatic.  Cardiovascular: Normal rate and regular rhythm.   No murmur heard. Pulmonary/Chest: Effort normal and breath sounds normal. No respiratory distress. He has no wheezes. He has no rales. He exhibits no tenderness.  Musculoskeletal:       Steady even gait.  + tenderness overlying right lower back.            Assessment & Plan:

## 2011-04-24 NOTE — Assessment & Plan Note (Signed)
Will plan to treat with short course of NSAIDS for now.  He is instructed to switch to tylenol after he finishes the meloxicam rx.  F/u in 1 month.  If symptoms worsen or if no improvement, will consider further imaging.

## 2011-04-24 NOTE — Patient Instructions (Signed)
Follow up in 1 month for a complete physical. Come fasting to this appointment.    Thoracic Strain Thoracic strain is an injury to the muscles of the upper back. Healing time for a mild strain may take only 1 week. Torn muscles or tendons may take 6 weeks to 2 months to heal. HOME CARE  Put ice on the injured area.   Put ice in a plastic bag.   Place a towel between your skin and the bag.   Leave the ice on for 15 minutes at a time, 2 times a day.   Your doctor will give you or your child exercises to do.   Warm up before being active.   Only take medicine as told by your doctor.   Meet with your physical therapist, if this applies.   Use wraps and back braces as told by your doctor.  GET HELP RIGHT AWAY IF:  There is more bruising, puffiness (swelling), or pain.   Medicine does not help the pain.   Your or your child's problems seem to be getting worse, not better.  MAKE SURE YOU:  Understand these instructions.   Will watch this condition.   Will get help right away if you or your child is not doing well or gets worse.  Document Released: 02/03/2008 Document Re-Released: 11/11/2009 [facility] Patient Information 2011 [place], [place].

## 2011-05-28 ENCOUNTER — Encounter: Payer: Self-pay | Admitting: Internal Medicine

## 2011-05-28 ENCOUNTER — Ambulatory Visit (INDEPENDENT_AMBULATORY_CARE_PROVIDER_SITE_OTHER): Payer: PRIVATE HEALTH INSURANCE | Admitting: Internal Medicine

## 2011-05-28 VITALS — BP 132/84 | HR 80 | Temp 98.4°F | Ht 69.0 in | Wt 274.0 lb

## 2011-05-28 DIAGNOSIS — E785 Hyperlipidemia, unspecified: Secondary | ICD-10-CM

## 2011-05-28 DIAGNOSIS — I10 Essential (primary) hypertension: Secondary | ICD-10-CM

## 2011-05-28 DIAGNOSIS — Z202 Contact with and (suspected) exposure to infections with a predominantly sexual mode of transmission: Secondary | ICD-10-CM

## 2011-05-28 DIAGNOSIS — Z9189 Other specified personal risk factors, not elsewhere classified: Secondary | ICD-10-CM

## 2011-05-28 DIAGNOSIS — Z0000 Encounter for general adult medical examination without abnormal findings: Secondary | ICD-10-CM

## 2011-05-28 DIAGNOSIS — Z23 Encounter for immunization: Secondary | ICD-10-CM

## 2011-05-28 MED ORDER — HYDROCHLOROTHIAZIDE 25 MG PO TABS: 25.0000 mg | ORAL_TABLET | Freq: Every day | ORAL | Status: DC

## 2011-05-28 MED ORDER — LOSARTAN POTASSIUM 100 MG PO TABS: 100.0000 mg | ORAL_TABLET | ORAL | Status: DC

## 2011-05-28 NOTE — Progress Notes (Signed)
  Subjective:    Patient ID: [name], male    DOB: [birth year], 45 y.o.   MRN: [id number]  HPI  45 year old African male with history of hypertension for routine physical. He denies significant interval medical history. His blood pressure has been fairly well controlled. He has occasional elevated readings. I am not sure whether he is using a sufficiently large blood pressure cuff at home.  He has history of boils from staph aureus. Several years ago be drained today 2-3 cm abscess in his right groin. He has had recurrences.  No associated fever chills.  They are more of a nuisance.  He is planning to visit his family in [place] for the next 3 weeks Review of Systems   Constitutional: Negative for activity change, appetite change and unexpected weight change.  Eyes: Negative for visual disturbance.  Respiratory: Negative for cough, chest tightness and shortness of breath.   Cardiovascular: Negative for chest pain.  Genitourinary: Negative for difficulty urinating.  Neurological: Negative for headaches.  Gastrointestinal: Negative for abdominal pain, heartburn melena or hematochezia Psych: Negative for depression or anxiety Endo:  No polyuria or polydypsia    No past medical history on file.  History   Social History  . Marital Status: Single    Spouse Name: N/A    Number of Children: N/A  . Years of Education: N/A   Occupational History  . Not on file.   Social History Main Topics  . Smoking status: Current Some Day Smoker    Types: Cigars  . Smokeless tobacco: Never Used  . Alcohol Use: Yes     occasional  . Drug Use: Not on file  . Sexually Active: Not on file   Other Topics Concern  . Not on file   Social History Narrative   Regular exercise: noCaffeine Use: "drinks caffeinated drinks all day"    No past surgical history on file.  No family history on file.  Allergies  Allergen Reactions  . Lisinopril     No current outpatient prescriptions on file  prior to visit.    BP 132/84  Pulse 80  Temp(Src) 98.4 F (36.9 C) (Oral)  Ht 5\' 9"  (1.753 m)  Wt 274 lb (124.286 kg)  BMI 40.46 kg/m2       Objective:   Physical Exam   Constitutional: Appears well-developed and well-nourished. No distress.  Head: Normocephalic and atraumatic.  Right Ear: External ear normal.  Left Ear: External ear normal.  Mouth/Throat: Oropharynx is clear and moist.  Eyes: Conjunctivae are normal. Pupils are equal, round, and reactive to light.  Neck: Normal range of motion. Neck supple. No thyromegaly present. No carotid bruit Cardiovascular: Normal rate, regular rhythm and normal heart sounds.  Exam reveals no gallop and no friction rub.   No murmur heard. Pulmonary/Chest: Effort normal and breath sounds normal.  No wheezes. No rales.  Abdominal: Soft. Bowel sounds are normal. No mass. There is no tenderness.  Neurological: Alert. No cranial nerve deficit.  Skin: Skin is warm and dry. Small subcentimeter carbuncle right lower abdomen,  no redness or drainage  Psychiatric: Normal mood and affect. Behavior is normal.         Assessment & Plan:

## 2011-05-28 NOTE — Assessment & Plan Note (Signed)
Reviewed adult health maintenance protocols. Weight loss encouraged. Reviewed healthy diet regular exercise. Goal weight loss of approximately 25 pounds Patient strongly encouraged to completely discontinue smoking Repeat lipid panel for risk stratification

## 2011-05-29 ENCOUNTER — Other Ambulatory Visit: Payer: PRIVATE HEALTH INSURANCE

## 2011-05-29 LAB — HEPATIC FUNCTION PANEL
ALT: 30 U/L (ref 0–53)
AST: 37 U/L (ref 0–37)
Albumin: 4.5 g/dL (ref 3.5–5.2)
Alkaline Phosphatase: 69 U/L (ref 39–117)
Bilirubin, Direct: 0.1 mg/dL (ref 0.0–0.3)
Total Bilirubin: 0.7 mg/dL (ref 0.3–1.2)
Total Protein: 8.2 g/dL (ref 6.0–8.3)

## 2011-05-29 LAB — CBC WITH DIFFERENTIAL/PLATELET
Basophils Relative: 0.5 % (ref 0.0–3.0)
Hemoglobin: 15 g/dL (ref 13.0–17.0)
Lymphocytes Relative: 38.7 % (ref 12.0–46.0)
MCHC: 32.1 g/dL (ref 30.0–36.0)
Monocytes Relative: 7.2 % (ref 3.0–12.0)
Neutro Abs: 3.2 10*3/uL (ref 1.4–7.7)
Neutrophils Relative %: 51.2 % (ref 43.0–77.0)
RBC: 5.39 Mil/uL (ref 4.22–5.81)
RDW: 13.3 % (ref 11.5–14.6)

## 2011-05-29 LAB — BASIC METABOLIC PANEL
BUN: 9 mg/dL (ref 6–23)
CO2: 25 mEq/L (ref 19–32)
Chloride: 104 mEq/L (ref 96–112)
GFR: 133.08 mL/min (ref >60.00)
Glucose, Bld: 106 mg/dL — ABNORMAL HIGH (ref 70–99)
Potassium: 3.6 mEq/L (ref 3.5–5.1)
Sodium: 139 mEq/L (ref 135–145)

## 2011-05-29 LAB — LIPID PANEL
Cholesterol: 239 mg/dL — ABNORMAL HIGH (ref 0–200)
HDL: 51.6 mg/dL (ref >39.00)
Total CHOL/HDL Ratio: 5

## 2011-06-01 ENCOUNTER — Encounter: Payer: Self-pay | Admitting: Internal Medicine

## 2011-06-04 ENCOUNTER — Other Ambulatory Visit: Payer: Self-pay | Admitting: Internal Medicine

## 2011-06-04 DIAGNOSIS — E785 Hyperlipidemia, unspecified: Secondary | ICD-10-CM

## 2011-06-04 MED ORDER — SIMVASTATIN 20 MG PO TABS: 20.0000 mg | ORAL_TABLET | Freq: Every evening | ORAL | Status: DC

## 2011-06-10 ENCOUNTER — Encounter: Payer: PRIVATE HEALTH INSURANCE | Admitting: Internal Medicine

## 2011-10-14 ENCOUNTER — Ambulatory Visit (INDEPENDENT_AMBULATORY_CARE_PROVIDER_SITE_OTHER): Payer: PRIVATE HEALTH INSURANCE | Admitting: Internal Medicine

## 2011-10-14 ENCOUNTER — Encounter: Payer: Self-pay | Admitting: Internal Medicine

## 2011-10-14 VITALS — BP 150/94 | HR 76 | Temp 98.7°F | Wt 284.0 lb

## 2011-10-14 DIAGNOSIS — M545 Low back pain, unspecified: Secondary | ICD-10-CM | POA: Insufficient documentation

## 2011-10-14 DIAGNOSIS — E785 Hyperlipidemia, unspecified: Secondary | ICD-10-CM

## 2011-10-14 DIAGNOSIS — R7309 Other abnormal glucose: Secondary | ICD-10-CM

## 2011-10-14 DIAGNOSIS — I10 Essential (primary) hypertension: Secondary | ICD-10-CM

## 2011-10-14 LAB — BASIC METABOLIC PANEL
BUN: 15 mg/dL (ref 6–23)
Calcium: 9.4 mg/dL (ref 8.4–10.5)
Chloride: 104 mEq/L (ref 96–112)
Creatinine, Ser: 0.9 mg/dL (ref 0.4–1.5)
GFR: 115.98 mL/min (ref >60.00)

## 2011-10-14 LAB — HEPATIC FUNCTION PANEL
AST: 32 U/L (ref 0–37)
Albumin: 4.3 g/dL (ref 3.5–5.2)
Alkaline Phosphatase: 61 U/L (ref 39–117)
Total Protein: 7.9 g/dL (ref 6.0–8.3)

## 2011-10-14 LAB — LIPID PANEL
Cholesterol: 198 mg/dL (ref 0–200)
HDL: 56.9 mg/dL (ref >39.00)
LDL Cholesterol: 119 mg/dL — ABNORMAL HIGH (ref 0–99)
Triglycerides: 110 mg/dL (ref 0.0–149.0)

## 2011-10-14 LAB — HEMOGLOBIN A1C: Hgb A1c MFr Bld: 5.9 % (ref 4.6–6.5)

## 2011-10-14 MED ORDER — HYDROCHLOROTHIAZIDE 25 MG PO TABS: 25.0000 mg | ORAL_TABLET | Freq: Every day | ORAL | Status: DC

## 2011-10-14 MED ORDER — TRAMADOL HCL 50 MG PO TABS: 50.0000 mg | ORAL_TABLET | Freq: Three times a day (TID) | ORAL | Status: AC | PRN

## 2011-10-14 MED ORDER — LOSARTAN POTASSIUM 100 MG PO TABS: 100.0000 mg | ORAL_TABLET | ORAL | Status: DC

## 2011-10-14 MED ORDER — SIMVASTATIN 20 MG PO TABS: 20.0000 mg | ORAL_TABLET | Freq: Every evening | ORAL | Status: DC

## 2011-10-14 NOTE — Patient Instructions (Signed)
Monitor your blood pressure at home Call if your systolic blood pressure is consistently greater that 140 Please work on losing approximately 25 lbs within next 6 months

## 2011-10-14 NOTE — Assessment & Plan Note (Signed)
BP is sporadically elevated today. Patient skipped today's dose of anti-hypertensives. I encouraged compliance with losartan and hydrochlorothiazide. Patient encouraged to monitor blood pressure at home and report persistently elevated readings. Weight loss encouraged. BP: 150/94 mmHg  Monitor BMET.

## 2011-10-14 NOTE — Progress Notes (Signed)
  Subjective:    Patient ID: [name], male    DOB: Jan 22, 1966, 46 y.o.   MRN: [id number]  HPI  46 year old African male with history of hypertension, obesity and tobacco abuse for followup. No significant interval medical history reported. Patient has been doing fairly well. He has intermittent low back pain.  His blood pressure sporadically elevated today. He did not take his blood pressure medications this morning.  Review of Systems Weight gain,  Negative for chest pain  No past medical history on file.  History   Social History  . Marital Status: Single    Spouse Name: N/A    Number of Children: N/A  . Years of Education: N/A   Occupational History  . Not on file.   Social History Main Topics  . Smoking status: Current Some Day Smoker    Types: Cigars  . Smokeless tobacco: Never Used  . Alcohol Use: Yes     occasional  . Drug Use: Not on file  . Sexually Active: Not on file   Other Topics Concern  . Not on file   Social History Narrative   Regular exercise: noCaffeine Use: "drinks caffeinated drinks all day"    No past surgical history on file.  No family history on file.  Allergies  Allergen Reactions  . Lisinopril     No current outpatient prescriptions on file prior to visit.    BP 150/94  Pulse 76  Temp(Src) 98.7 F (37.1 C) (Oral)  Wt 284 lb (128.822 kg)     Objective:   Physical Exam  Musculoskeletal: Normal range of motion.       Patient able to flex lumbar spine without pain. Mild discomfort with lumbar extension. No lower extremity weakness. Patient able to toe heel walk without difficulty.  Neurological: He has normal reflexes.     Constitutional: Appears well-developed and well-nourished. No distress.  Cardiovascular: Normal rate, regular rhythm and normal heart sounds.  Exam reveals no gallop and no friction rub.  No murmur heard. Pulmonary/Chest: Effort normal and breath sounds normal.  No wheezes. No rales.  Skin: Skin is warm  and dry. Trace lower extremity edema Psychiatric: Normal mood and affect. Behavior is normal.      Assessment & Plan:

## 2011-10-14 NOTE — Assessment & Plan Note (Signed)
A 46 year old with intermittent low back pain. I suspect symptoms are secondary to spondylosis. Use tramadol as needed.

## 2011-10-16 ENCOUNTER — Encounter: Payer: Self-pay | Admitting: Internal Medicine

## 2011-10-19 ENCOUNTER — Ambulatory Visit: Payer: PRIVATE HEALTH INSURANCE | Admitting: Internal Medicine

## 2011-10-21 ENCOUNTER — Ambulatory Visit: Payer: PRIVATE HEALTH INSURANCE | Admitting: Internal Medicine

## 2011-10-21 ENCOUNTER — Other Ambulatory Visit: Payer: Self-pay | Admitting: *Deleted

## 2011-10-21 MED ORDER — POTASSIUM CHLORIDE CRYS ER 20 MEQ PO TBCR: 20.0000 meq | EXTENDED_RELEASE_TABLET | Freq: Every day | ORAL | Status: DC

## 2012-01-02 ENCOUNTER — Ambulatory Visit: Payer: PRIVATE HEALTH INSURANCE | Admitting: Family Medicine

## 2012-01-02 VITALS — BP 134/83 | HR 86 | Temp 97.8°F | Resp 16 | Ht 70.0 in | Wt 279.0 lb

## 2012-01-02 DIAGNOSIS — R002 Palpitations: Secondary | ICD-10-CM

## 2012-01-02 MED ORDER — ALPRAZOLAM 0.25 MG PO TABS: 0.2500 mg | ORAL_TABLET | Freq: Every evening | ORAL | Status: AC | PRN

## 2012-01-02 NOTE — Progress Notes (Signed)
  Subjective:    Patient ID: [name], male    DOB: 1966-03-09, 46 y.o.   MRN: [id number]  HPI 46 yo male with c/o palpitations. Occurring several times a day for one month.  Episodes will last a few seconds.  No pain or shortness of breath. Has been under a lot of stress.  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  Very stressed and anxious about this.   States saw a cardiologist several years ago - can't remember who - for chest pain.  States everything checked out okay then.  Has not been back.  Sees Dr. Artist Pais.  Labs checked in Feb.  Borderline cholesterol and sugar (pre-diabetic).     Review of Systems Negative except as per HPI     Objective:   Physical Exam  Constitutional: He appears well-developed and well-nourished.  Cardiovascular: Normal rate, regular rhythm, normal heart sounds and intact distal pulses.   No murmur heard. Pulmonary/Chest: Effort normal and breath sounds normal.  Neurological: He is alert.  Skin: Skin is warm and dry.    EKG: LAD, otherwise normal      Assessment & Plan:  Palpitations - check TSH.  Likely anxiety related but will send for cardiology eval.  May need holter monitor.  In meantime try xanax 0.5 daily prn #30 no refills.

## 2012-01-03 LAB — TSH: TSH: 1.24 u[IU]/mL (ref 0.350–4.500)

## 2012-02-29 ENCOUNTER — Ambulatory Visit (HOSPITAL_BASED_OUTPATIENT_CLINIC_OR_DEPARTMENT_OTHER): Payer: PRIVATE HEALTH INSURANCE

## 2012-05-13 ENCOUNTER — Ambulatory Visit (INDEPENDENT_AMBULATORY_CARE_PROVIDER_SITE_OTHER): Payer: PRIVATE HEALTH INSURANCE | Admitting: Internal Medicine

## 2012-05-13 ENCOUNTER — Encounter: Payer: Self-pay | Admitting: Internal Medicine

## 2012-05-13 VITALS — BP 124/84 | HR 81 | Temp 98.1°F | Wt 270.0 lb

## 2012-05-13 DIAGNOSIS — R7309 Other abnormal glucose: Secondary | ICD-10-CM

## 2012-05-13 DIAGNOSIS — M79609 Pain in unspecified limb: Secondary | ICD-10-CM

## 2012-05-13 DIAGNOSIS — I10 Essential (primary) hypertension: Secondary | ICD-10-CM

## 2012-05-13 DIAGNOSIS — R3129 Other microscopic hematuria: Secondary | ICD-10-CM

## 2012-05-13 DIAGNOSIS — M25572 Pain in left ankle and joints of left foot: Secondary | ICD-10-CM

## 2012-05-13 DIAGNOSIS — E785 Hyperlipidemia, unspecified: Secondary | ICD-10-CM

## 2012-05-13 DIAGNOSIS — M25579 Pain in unspecified ankle and joints of unspecified foot: Secondary | ICD-10-CM

## 2012-05-13 DIAGNOSIS — M79672 Pain in left foot: Secondary | ICD-10-CM

## 2012-05-13 LAB — BASIC METABOLIC PANEL
BUN: 10 mg/dL (ref 6–23)
Calcium: 9.3 mg/dL (ref 8.4–10.5)
Creatinine, Ser: 0.8 mg/dL (ref 0.4–1.5)
GFR: 128.82 mL/min (ref >60.00)

## 2012-05-13 LAB — URINALYSIS, ROUTINE W REFLEX MICROSCOPIC
Bilirubin Urine: NEGATIVE
Ketones, ur: NEGATIVE
Total Protein, Urine: NEGATIVE
Urine Glucose: NEGATIVE

## 2012-05-13 LAB — CBC WITH DIFFERENTIAL/PLATELET
Basophils Relative: 0.6 % (ref 0.0–3.0)
Eosinophils Relative: 3.4 % (ref 0.0–5.0)
HCT: 42.5 % (ref 39.0–52.0)
Lymphs Abs: 2.3 10*3/uL (ref 0.7–4.0)
MCV: 85.9 fl (ref 78.0–100.0)
Monocytes Absolute: 0.5 10*3/uL (ref 0.1–1.0)
RBC: 4.95 Mil/uL (ref 4.22–5.81)
WBC: 5.5 10*3/uL (ref 4.5–10.5)

## 2012-05-13 LAB — HEMOGLOBIN A1C: Hgb A1c MFr Bld: 5.6 % (ref 4.6–6.5)

## 2012-05-13 MED ORDER — HYDROCHLOROTHIAZIDE 25 MG PO TABS: 25.0000 mg | ORAL_TABLET | Freq: Every day | ORAL | Status: DC

## 2012-05-13 MED ORDER — SIMVASTATIN 20 MG PO TABS: 20.0000 mg | ORAL_TABLET | Freq: Every evening | ORAL | Status: DC

## 2012-05-13 MED ORDER — LOSARTAN POTASSIUM 100 MG PO TABS: 100.0000 mg | ORAL_TABLET | ORAL | Status: DC

## 2012-05-13 NOTE — Assessment & Plan Note (Addendum)
Patient painful left foot associated with HV.  Refer to podiatry for further evaluation and treatment. (Dr. Ralene Cork)

## 2012-05-13 NOTE — Patient Instructions (Addendum)
Our office will contact you re: blood and urine test results We will schedule referral to foot doctor for you. If you urinalysis shows persistent microscopic hematuria, we may need to refer you to urologist for further evaluation

## 2012-05-13 NOTE — Assessment & Plan Note (Signed)
Blood pressure has improved. No changes to blood pressure medication regimen. Monitor electrolytes and kidney function.  BP: 124/84 mmHg  Lab Results  Component Value Date   CREATININE 0.9 10/14/2011

## 2012-05-13 NOTE — Assessment & Plan Note (Signed)
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 He is asymptomatic. His previous urine tests have been normal. Repeat urinalysis with reflex microscopy today. If positive, initiate further workup including CT of abdomen and pelvis and referral to urology.

## 2012-05-13 NOTE — Progress Notes (Signed)
  Subjective:    Patient ID: [name], male    DOB: 1966-04-18, 46 y.o.   MRN: [id number]  HPI  46 year old African male with history of hypertension and mild elevated blood sugar for followup. 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 Routine urinalysis was positive for 2+ blood. Patient denies any urinary symptoms. His chart was reviewed. He does not have history of microscopic hematuria in the past. He denies any history of kidney stones. He denies history of flank pain.  Patient complains of pain in his left foot. His first metatarsal joint is getting slightly larger in size. It is uncomfortable for him to wear regular shoes. He has a wide foot.    Review of Systems No change in weight    No past medical history on file.  History   Social History  . Marital Status: Divorced    Spouse Name: N/A    Number of Children: N/A  . Years of Education: N/A   Occupational History  . Not on file.   Social History Main Topics  . Smoking status: Current Some Day Smoker    Types: Cigars  . Smokeless tobacco: Never Used  . Alcohol Use: Yes     occasional  . Drug Use: Not on file  . Sexually Active: Not on file   Other Topics Concern  . Not on file   Social History Narrative   Regular exercise: noCaffeine Use: "drinks caffeinated drinks all day"    No past surgical history on file.  No family history on file.  Allergies  Allergen Reactions  . Lisinopril     Current Outpatient Prescriptions on File Prior to Visit  Medication Sig Dispense Refill  . DISCONTD: losartan (COZAAR) 100 MG tablet Take 1 tablet (100 mg total) by mouth every morning.  90 tablet  1  . DISCONTD: simvastatin (ZOCOR) 20 MG tablet Take 1 tablet (20 mg total) by mouth every evening.  90 tablet  1  . potassium chloride SA (K-DUR,KLOR-CON) 20 MEQ tablet Take 1 tablet (20 mEq total) by mouth daily.  90 tablet  1    BP 124/84  Pulse 81  Temp 98.1 F (36.7 C) (Oral)  Wt 270 lb (122.471 kg)  SpO2  96%    Objective:   Physical Exam  Constitutional: He is oriented to person, place, and time. He appears well-developed and well-nourished.  Cardiovascular: Regular rhythm and normal heart sounds.   Pulmonary/Chest: Breath sounds normal. He has no wheezes.  Abdominal: Soft. Bowel sounds are normal. He exhibits no mass. There is no tenderness.       No flank tenderness  Genitourinary: Penis normal.       Testicular exam is normal  Musculoskeletal:       Trace lower extremity edema bilaterally  Neurological: He is alert and oriented to person, place, and time.  Psychiatric: He has a normal mood and affect. His behavior is normal.          Assessment & Plan:

## 2013-01-31 ENCOUNTER — Other Ambulatory Visit (INDEPENDENT_AMBULATORY_CARE_PROVIDER_SITE_OTHER): Payer: PRIVATE HEALTH INSURANCE

## 2013-01-31 DIAGNOSIS — Z0000 Encounter for general adult medical examination without abnormal findings: Secondary | ICD-10-CM

## 2013-01-31 LAB — CBC WITH DIFFERENTIAL/PLATELET
Basophils Relative: 0.7 % (ref 0.0–3.0)
Eosinophils Absolute: 0.3 10*3/uL (ref 0.0–0.7)
Hemoglobin: 14.6 g/dL (ref 13.0–17.0)
Lymphocytes Relative: 42.2 % (ref 12.0–46.0)
MCHC: 32.8 g/dL (ref 30.0–36.0)
Neutro Abs: 3.2 10*3/uL (ref 1.4–7.7)
RBC: 5.17 Mil/uL (ref 4.22–5.81)

## 2013-01-31 LAB — POCT URINALYSIS DIPSTICK
Glucose, UA: NEGATIVE
Ketones, UA: NEGATIVE
Leukocytes, UA: NEGATIVE
Spec Grav, UA: >=1.03

## 2013-01-31 LAB — HEPATIC FUNCTION PANEL
AST: 29 U/L (ref 0–37)
Albumin: 4.1 g/dL (ref 3.5–5.2)
Alkaline Phosphatase: 56 U/L (ref 39–117)
Total Protein: 7.3 g/dL (ref 6.0–8.3)

## 2013-01-31 LAB — BASIC METABOLIC PANEL
CO2: 24 mEq/L (ref 19–32)
Calcium: 9.4 mg/dL (ref 8.4–10.5)
Chloride: 107 mEq/L (ref 96–112)
Sodium: 139 mEq/L (ref 135–145)

## 2013-01-31 LAB — LIPID PANEL
Cholesterol: 192 mg/dL (ref 0–200)
Total CHOL/HDL Ratio: 4
Triglycerides: 294 mg/dL — ABNORMAL HIGH (ref 0.0–149.0)
VLDL: 58.8 mg/dL — ABNORMAL HIGH (ref 0.0–40.0)

## 2013-02-02 ENCOUNTER — Encounter: Payer: Self-pay | Admitting: Internal Medicine

## 2013-02-02 ENCOUNTER — Ambulatory Visit (INDEPENDENT_AMBULATORY_CARE_PROVIDER_SITE_OTHER): Payer: PRIVATE HEALTH INSURANCE | Admitting: Internal Medicine

## 2013-02-02 VITALS — BP 142/88 | HR 92 | Temp 98.3°F | Ht 70.0 in | Wt 279.0 lb

## 2013-02-02 DIAGNOSIS — R3129 Other microscopic hematuria: Secondary | ICD-10-CM

## 2013-02-02 DIAGNOSIS — Z0000 Encounter for general adult medical examination without abnormal findings: Secondary | ICD-10-CM

## 2013-02-02 DIAGNOSIS — E785 Hyperlipidemia, unspecified: Secondary | ICD-10-CM

## 2013-02-02 LAB — URINALYSIS, ROUTINE W REFLEX MICROSCOPIC
Ketones, ur: NEGATIVE
Specific Gravity, Urine: >=1.03 (ref 1.000–1.030)
Total Protein, Urine: NEGATIVE
Urine Glucose: NEGATIVE
Urobilinogen, UA: 0.2 (ref 0.0–1.0)

## 2013-02-02 MED ORDER — SIMVASTATIN 20 MG PO TABS: 20.0000 mg | ORAL_TABLET | Freq: Every evening | ORAL | Status: DC

## 2013-02-02 MED ORDER — POTASSIUM CHLORIDE CRYS ER 20 MEQ PO TBCR: 20.0000 meq | EXTENDED_RELEASE_TABLET | Freq: Every day | ORAL | Status: DC

## 2013-02-02 MED ORDER — DICLOFENAC SODIUM 1 % TD GEL: 2.0000 g | Freq: Three times a day (TID) | TRANSDERMAL | Status: DC

## 2013-02-02 MED ORDER — LOSARTAN POTASSIUM 100 MG PO TABS: 100.0000 mg | ORAL_TABLET | ORAL | Status: DC

## 2013-02-02 NOTE — Patient Instructions (Addendum)
Please try to lose 30-40 lbs within the next 6-12 months Avoid sweets, sugary beverages and decrease your intake of carbohydrates Please complete the following lab tests before your next follow up appointment: CPX labs with PSA

## 2013-02-02 NOTE — Progress Notes (Signed)
  Subjective:    Patient ID: [name], male    DOB: Oct 13, 1965, 47 y.o.   MRN: [id number]  HPI  47 year old African male for routine physical. He denies any significant interval medical history. Patient is compliant with his antihypertensives and cholesterol medication. Unfortunately he has gained approximately 9 pounds since previous visit. He reports his weight is "up and down".  Review of Systems  Constitutional: Negative for activity change, appetite change  Eyes: Negative for visual disturbance.  Respiratory: Negative for cough, chest tightness and shortness of breath.   Cardiovascular: Negative for chest pain.  Genitourinary: Negative for difficulty urinating.  Neurological: Negative for headaches.  Gastrointestinal: Negative for abdominal pain, heartburn melena or hematochezia Psych: Negative for depression or anxiety Endo:  No polyuria or polydypsia Musculoskeletal:   He complains of intermittent pain in knuckles of both hands,  No redness or swelling.  No morning stiffness  Past Medical History  Diagnosis Date  . Hypertension   . Hyperlipidemia   . Obesity   . Abnormal glucose   . Carbuncles     History   Social History  . Marital Status: Divorced    Spouse Name: N/A    Number of Children: N/A  . Years of Education: N/A   Occupational History  . Not on file.   Social History Main Topics  . Smoking status: Current Some Day Smoker    Types: Cigars  . Smokeless tobacco: Never Used  . Alcohol Use: Yes     Comment: occasional  . Drug Use: No  . Sexually Active: Not on file   Other Topics Concern  . Not on file   Social History Narrative   Regular exercise: no   Works as Naval architect    No past surgical history on file.  No family history on file.  Allergies  Allergen Reactions  . Lisinopril     No current outpatient prescriptions on file prior to visit.   No current facility-administered medications on file prior to visit.    BP 142/88   Pulse 92  Temp(Src) 98.3 F (36.8 C) (Oral)  Ht 5\' 10"  (1.778 m)  Wt 279 lb (126.554 kg)  BMI 40.03 kg/m2       Objective:   Physical Exam  Constitutional: He is oriented to person, place, and time. He appears well-developed and well-nourished.  HENT:  Head: Normocephalic and atraumatic.  Right Ear: External ear normal.  Left Ear: External ear normal.  Mouth/Throat: Oropharynx is clear and moist.  Eyes: EOM are normal. Pupils are equal, round, and reactive to light.  Neck: Neck supple.  No carotid bruit  Cardiovascular: Normal rate, regular rhythm and normal heart sounds.   Pulmonary/Chest: Effort normal and breath sounds normal. He has no wheezes.  Abdominal: Soft. Bowel sounds are normal. He exhibits no mass. There is no tenderness.  Musculoskeletal: He exhibits no edema.  Lymphadenopathy:    He has no cervical adenopathy.  Neurological: He is alert and oriented to person, place, and time. No cranial nerve deficit.  Skin: Skin is warm and dry. No rash noted.  Psychiatric: He has a normal mood and affect. His behavior is normal.          Assessment & Plan:

## 2013-02-02 NOTE — Assessment & Plan Note (Addendum)
Reviewed adult health maintenance protocols.  I encouraged weight loss. Patient also has mild hyperglycemia. We discussed his risk of developing type 2 diabetes. I encouraged patient avoid sweets and decrease his carbohydrate intake. Patient up-to-date with adult vaccines. His PSA is normal. Digital rectal exam deferred until next physical.  Continue same dose of antihypertensives and cholesterol medication.

## 2013-02-10 ENCOUNTER — Telehealth: Payer: Self-pay | Admitting: Internal Medicine

## 2013-02-10 NOTE — Telephone Encounter (Signed)
Pharm following request for diclofenac sodium (VOLTAREN) 1 % GEL Pharm would like to know what/if they have decided.

## 2013-03-01 NOTE — Telephone Encounter (Signed)
This was re a refill, not a PA. Matter has been taken care of.

## 2013-04-05 ENCOUNTER — Other Ambulatory Visit: Payer: Self-pay | Admitting: Internal Medicine

## 2013-04-05 ENCOUNTER — Telehealth: Payer: Self-pay | Admitting: Internal Medicine

## 2013-04-05 MED ORDER — HYDROCHLOROTHIAZIDE 25 MG PO TABS: ORAL_TABLET | ORAL | Status: DC

## 2013-04-05 NOTE — Telephone Encounter (Signed)
rx sent in electronically 

## 2013-04-05 NOTE — Telephone Encounter (Signed)
PT would like a 3 month supply of hydrochlorothiazide (HYDRODIURIL) 25 MG tablet, called [facility] [place]. Please assist.

## 2013-05-30 ENCOUNTER — Ambulatory Visit (INDEPENDENT_AMBULATORY_CARE_PROVIDER_SITE_OTHER)
Admission: RE | Admit: 2013-05-30 | Discharge: 2013-05-30 | Disposition: A | Discharge status: Home / Self Care | Payer: PRIVATE HEALTH INSURANCE | Source: Ambulatory Visit | Attending: Internal Medicine | Admitting: Internal Medicine

## 2013-05-30 ENCOUNTER — Ambulatory Visit (INDEPENDENT_AMBULATORY_CARE_PROVIDER_SITE_OTHER): Payer: PRIVATE HEALTH INSURANCE | Admitting: Internal Medicine

## 2013-05-30 ENCOUNTER — Encounter: Payer: Self-pay | Admitting: Internal Medicine

## 2013-05-30 VITALS — BP 142/82 | HR 104 | Temp 97.9°F | Ht 70.0 in | Wt 275.0 lb

## 2013-05-30 DIAGNOSIS — M79601 Pain in right arm: Secondary | ICD-10-CM

## 2013-05-30 DIAGNOSIS — M79609 Pain in unspecified limb: Secondary | ICD-10-CM

## 2013-05-30 DIAGNOSIS — M542 Cervicalgia: Secondary | ICD-10-CM

## 2013-05-30 MED ORDER — HYDROCODONE-ACETAMINOPHEN 5-325 MG PO TABS: 1.0000 | ORAL_TABLET | Freq: Four times a day (QID) | ORAL | Status: DC | PRN
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
## 2013-05-31 ENCOUNTER — Telehealth: Payer: Self-pay | Admitting: Internal Medicine

## 2013-05-31 NOTE — Telephone Encounter (Signed)
Pt called and stated that he did not receive an rx of his HYDROcodone-acetaminophen (NORCO/VICODIN) 5-325 MG per tablet, and it was not called into his pharmacy. Please call this into the [facility] off [address]. Thank you!

## 2013-06-01 NOTE — Telephone Encounter (Signed)
rx called in

## 2013-06-10 ENCOUNTER — Inpatient Hospital Stay: Admission: RE | Admit: 2013-06-10 | Payer: PRIVATE HEALTH INSURANCE | Source: Ambulatory Visit

## 2013-07-06 ENCOUNTER — Other Ambulatory Visit: Payer: Self-pay

## 2013-08-02 ENCOUNTER — Ambulatory Visit (INDEPENDENT_AMBULATORY_CARE_PROVIDER_SITE_OTHER): Payer: PRIVATE HEALTH INSURANCE | Admitting: Internal Medicine

## 2013-08-02 ENCOUNTER — Encounter: Payer: Self-pay | Admitting: Internal Medicine

## 2013-08-02 VITALS — BP 122/82 | Temp 98.8°F | Wt 280.0 lb

## 2013-08-02 DIAGNOSIS — R51 Headache: Secondary | ICD-10-CM

## 2013-08-02 DIAGNOSIS — E785 Hyperlipidemia, unspecified: Secondary | ICD-10-CM

## 2013-08-02 DIAGNOSIS — R519 Headache, unspecified: Secondary | ICD-10-CM | POA: Insufficient documentation

## 2013-08-02 DIAGNOSIS — I10 Essential (primary) hypertension: Secondary | ICD-10-CM

## 2013-08-02 MED ORDER — METHOCARBAMOL 500 MG PO TABS: 500.0000 mg | ORAL_TABLET | Freq: Three times a day (TID) | ORAL | Status: DC | PRN

## 2013-08-02 MED ORDER — HYDROCHLOROTHIAZIDE 25 MG PO TABS: ORAL_TABLET | ORAL | Status: DC

## 2013-08-02 MED ORDER — LOSARTAN POTASSIUM 100 MG PO TABS: 100.0000 mg | ORAL_TABLET | ORAL | Status: DC

## 2013-08-02 MED ORDER — POTASSIUM CHLORIDE CRYS ER 20 MEQ PO TBCR: 20.0000 meq | EXTENDED_RELEASE_TABLET | Freq: Every day | ORAL | Status: DC

## 2013-08-02 MED ORDER — SIMVASTATIN 20 MG PO TABS: 20.0000 mg | ORAL_TABLET | Freq: Every evening | ORAL | Status: DC

## 2013-08-02 NOTE — Assessment & Plan Note (Signed)
Continue same dose of simvastatin.

## 2013-08-02 NOTE — Assessment & Plan Note (Signed)
Stable.  No change in medication.  BP: 122/82 mmHg  Lab Results  Component Value Date   CREATININE 0.8 01/31/2013

## 2013-08-02 NOTE — Patient Instructions (Signed)
Please contact our office if your symptoms do not improve or gets worse.  

## 2013-08-02 NOTE — Progress Notes (Signed)
   Subjective:    Patient ID: [name], male    DOB: 01-10-1966, 47 y.o.   MRN: [id number]  Headache   47 year old African male with history of hypertension, hyperlipidemia and obesity complains of new onset headache for last 2 weeks. He reports discomfort in lower neck/occipital area. He rates severity is 5/10. He has been using Tylenol over-the-counter with some improvement. Patient suspected to have possible obstructive sleep apnea. He had sleep test 2 years ago which was negative.  Hypertension-stable  Hyperlipidemia-stable    Review of Systems  Neurological: Positive for headaches.   Negative for fever chills, negative for injury or trauma.  No changes in vision, no upper extremity weakness    Past Medical History  Diagnosis Date  . Hypertension   . Hyperlipidemia   . Obesity   . Abnormal glucose   . Carbuncles     History   Social History  . Marital Status: Divorced    Spouse Name: N/A    Number of Children: N/A  . Years of Education: N/A   Occupational History  . Not on file.   Social History Main Topics  . Smoking status: Current Some Day Smoker    Types: Cigars  . Smokeless tobacco: Never Used  . Alcohol Use: Yes     Comment: occasional  . Drug Use: No  . Sexual Activity: Not on file   Other Topics Concern  . Not on file   Social History Narrative   Regular exercise: no   Works as Naval architect    No past surgical history on file.  No family history on file.  Allergies  Allergen Reactions  . Lisinopril     Current Outpatient Prescriptions on File Prior to Visit  Medication Sig Dispense Refill  . diclofenac sodium (VOLTAREN) 1 % GEL Apply 2 g topically 3 (three) times daily.  200 g  1  . HYDROcodone-acetaminophen (NORCO/VICODIN) 5-325 MG per tablet Take 1 tablet by mouth every 6 (six) hours as needed for pain.  30 tablet  0   No current facility-administered medications on file prior to visit.    BP 122/82  Temp(Src) 98.8 F (37.1 C)  (Oral)  Wt 280 lb (127.007 kg)    Objective:   Physical Exam  Constitutional: He is oriented to person, place, and time. He appears well-developed and well-nourished. No distress.  HENT:  Head: Normocephalic and atraumatic.  Right Ear: External ear normal.  Left Ear: External ear normal.  Eyes: Conjunctivae and EOM are normal. Pupils are equal, round, and reactive to light.  No defects in peripheral vision  Neck: Normal range of motion.  Mild discomfort posterior aspect of cervical spine and occipital area  Cardiovascular: Normal rate, regular rhythm and normal heart sounds.   No murmur heard. Pulmonary/Chest: Effort normal and breath sounds normal. He has no wheezes.  Musculoskeletal:  Trace lower extremity edema bilaterally  Neurological: He is alert and oriented to person, place, and time. He has normal reflexes. He displays normal reflexes. No cranial nerve deficit. He exhibits normal muscle tone. Coordination normal.  Negative Romberg, no pronator drift, no dysmetria or other cerebellar signs  Skin: Skin is warm and dry.  Psychiatric: He has a normal mood and affect. His behavior is normal.          Assessment & Plan:

## 2013-08-02 NOTE — Assessment & Plan Note (Signed)
Patient experiencing occipital headaches. I suspect his symptoms secondary to tension headaches. Utilizedmyofascial release techniques to cervical spine. Also utilize HVLA to correct somatic dysfunction. Patient tolerated well without complication. Use Robaxin 500 mg 3 times a day as needed. Patient advised to call office if symptoms persist or worsen.

## 2013-09-27 ENCOUNTER — Telehealth: Payer: Self-pay | Admitting: Internal Medicine

## 2013-09-27 NOTE — Telephone Encounter (Signed)
Ok per Dr Yoo 

## 2013-09-27 NOTE — Telephone Encounter (Signed)
Pt requesting to see only dr Shawna Orleans pt has hemorroids and wants to come in Wednesday 10/04/13 same day. Ok to schedule ?

## 2013-09-27 NOTE — Telephone Encounter (Signed)
appt scheduled for 10/04/13 @ 4:00pm.

## 2013-10-04 ENCOUNTER — Encounter: Payer: Self-pay | Admitting: Internal Medicine

## 2013-10-04 ENCOUNTER — Ambulatory Visit (INDEPENDENT_AMBULATORY_CARE_PROVIDER_SITE_OTHER): Payer: No Typology Code available for payment source | Admitting: Internal Medicine

## 2013-10-04 VITALS — BP 150/102 | Temp 98.9°F | Ht 70.0 in | Wt 283.0 lb

## 2013-10-04 DIAGNOSIS — K648 Other hemorrhoids: Secondary | ICD-10-CM

## 2013-10-04 DIAGNOSIS — I10 Essential (primary) hypertension: Secondary | ICD-10-CM

## 2013-10-04 MED ORDER — HYDROCORTISONE ACETATE 25 MG RE SUPP: 25.0000 mg | Freq: Two times a day (BID) | RECTAL | Status: DC

## 2013-10-04 MED ORDER — HYDROCHLOROTHIAZIDE 25 MG PO TABS: ORAL_TABLET | ORAL | Status: DC

## 2013-10-04 MED ORDER — VALSARTAN 160 MG PO TABS: 160.0000 mg | ORAL_TABLET | Freq: Every day | ORAL | Status: DC

## 2013-10-04 NOTE — Progress Notes (Signed)
Pre visit review using our clinic review tool, if applicable. No additional management support is needed unless otherwise documented below in the visit note. 

## 2013-10-04 NOTE — Assessment & Plan Note (Addendum)
Blood pressure is uncontrolled. Discontinue losartan. Replace with valsartan 160 mg. If blood pressure still uncontrolled consider add amlodipine 5 mg.  BMET before next OV.  Reassess in 6 weeks. BP: 150/102 mmHg

## 2013-10-04 NOTE — Progress Notes (Signed)
Subjective:    Patient ID: [name], male    DOB: 1966/01/04, 48 y.o.   MRN: [id number]  HPI  48 year old African male with history of hypertension, hyperlipidemia and abnormal glucose for followup. Patient's blood pressure is elevated today. He reports good medication compliance. He has been trying to lose weight but has been unsuccessful. 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  Patient complains of symptomatic internal hemorrhoids. He complains of intermittent bleeding, pain and pruritus. His symptoms gradually getting worse over the last 1 to 2 months.  His internal hemorrhoids have been bothering him for years.  Review of Systems Negative for constipation, negative for chest pain or shortness of breath   Past Medical History  Diagnosis Date  . Hypertension   . Hyperlipidemia   . Obesity   . Abnormal glucose   . Carbuncles     History   Social History  . Marital Status: Divorced    Spouse Name: N/A    Number of Children: N/A  . Years of Education: N/A   Occupational History  . Not on file.   Social History Main Topics  . Smoking status: Current Some Day Smoker    Types: Cigars  . Smokeless tobacco: Never Used  . Alcohol Use: Yes     Comment: occasional  . Drug Use: No  . Sexual Activity: Not on file   Other Topics Concern  . Not on file   Social History Narrative   Regular exercise: no   Works as Administrator    No past surgical history on file.  No family history on file.  Allergies  Allergen Reactions  . Lisinopril     Current Outpatient Prescriptions on File Prior to Visit  Medication Sig Dispense Refill  . diclofenac sodium (VOLTAREN) 1 % GEL Apply 2 g topically 3 (three) times daily.  200 g  1  . HYDROcodone-acetaminophen (NORCO/VICODIN) 5-325 MG per tablet Take 1 tablet by mouth every 6 (six) hours as needed for pain.  30 tablet  0  . methocarbamol (ROBAXIN) 500 MG tablet Take 1 tablet (500 mg total) by mouth  every 8 (eight) hours as needed for muscle spasms.  30 tablet  0  . potassium chloride SA (K-DUR,KLOR-CON) 20 MEQ tablet Take 1 tablet (20 mEq total) by mouth daily.  90 tablet  3  . simvastatin (ZOCOR) 20 MG tablet Take 1 tablet (20 mg total) by mouth every evening.  90 tablet  3   No current facility-administered medications on file prior to visit.    BP 150/102  Temp(Src) 98.9 F (37.2 C) (Oral)  Ht 5\' 10"  (1.778 m)  Wt 283 lb (128.368 kg)  BMI 40.61 kg/m2     Objective:   Physical Exam  Constitutional: He is oriented to person, place, and time. He appears well-developed and well-nourished. No distress.  HENT:  Head: Normocephalic and atraumatic.  Neck: Neck supple.  Cardiovascular: Normal rate, regular rhythm and normal heart sounds.   No murmur heard. Pulmonary/Chest: Effort normal and breath sounds normal. He has no wheezes.  Genitourinary:  Internal hemorrhoids  Musculoskeletal: He exhibits no edema.  Lymphadenopathy:    He has no cervical adenopathy.  Neurological: He is alert and oriented to person, place, and time. No cranial nerve deficit.  Skin: Skin is warm and dry.  Psychiatric: He has a normal mood and affect. His behavior is normal.          Assessment & Plan:

## 2013-10-04 NOTE — Patient Instructions (Signed)
Please complete the following lab tests before your next follow up appointment: BMET - 401.9 

## 2013-10-04 NOTE — Assessment & Plan Note (Signed)
Patient with symptomatic internal hemorrhoids. They are bleeding intermittently. Use Anusol suppositories as directed. Refer to Dr. Carlean Purl for possible banding.  Also use Metamucil once to twice daily.

## 2013-10-06 ENCOUNTER — Telehealth: Payer: Self-pay | Admitting: Internal Medicine

## 2013-10-06 NOTE — Telephone Encounter (Signed)
Relevant patient education assigned to patient using Emmi. ° °

## 2013-10-12 ENCOUNTER — Encounter: Payer: Self-pay | Admitting: Gastroenterology

## 2013-11-20 ENCOUNTER — Ambulatory Visit (INDEPENDENT_AMBULATORY_CARE_PROVIDER_SITE_OTHER): Payer: No Typology Code available for payment source | Admitting: Gastroenterology

## 2013-11-20 ENCOUNTER — Encounter: Payer: Self-pay | Admitting: Gastroenterology

## 2013-11-20 VITALS — BP 146/100 | HR 84 | Ht 69.0 in | Wt 289.0 lb

## 2013-11-20 DIAGNOSIS — L290 Pruritus ani: Secondary | ICD-10-CM

## 2013-11-20 DIAGNOSIS — R109 Unspecified abdominal pain: Secondary | ICD-10-CM

## 2013-11-20 DIAGNOSIS — R198 Other specified symptoms and signs involving the digestive system and abdomen: Secondary | ICD-10-CM

## 2013-11-20 MED ORDER — HYOSCYAMINE SULFATE 0.125 MG SL SUBL: SUBLINGUAL_TABLET | SUBLINGUAL | Status: DC

## 2013-11-20 MED ORDER — PEG-KCL-NACL-NASULF-NA ASC-C 100 G PO SOLR: 1.0000 | Freq: Once | ORAL | Status: DC

## 2013-11-20 NOTE — Patient Instructions (Addendum)
You have been given a separate informational sheet regarding your tobacco use, the importance of quitting and local resources to help you quit.  We have sent the following medications to your pharmacy for you to pick up at your convenience:Levsin.  You have been scheduled for a colonoscopy with propofol. Please follow written instructions given to you at your visit today.  Please pick up your prep kit at the pharmacy within the next 1-3 days. If you use inhalers (even only as needed), please bring them with you on the day of your procedure. Your physician has requested that you go to www.startemmi.com and enter the access code given to you at your visit today. This web site gives a general overview about your procedure. However, you should still follow specific instructions given to you by our office regarding your preparation for the procedure.  Thank you for choosing me and [facility].  Pricilla Riffle. Dagoberto Ligas., MD., Marval Regal

## 2013-11-20 NOTE — Progress Notes (Signed)
    History of Present Illness: This is a 48 year old male who relates frequent about one year history of more frequent bowel movements occurring 3-4 times a day generally following meals, rectal itching and the sensation of something moving in his lower abdomen and rectum. She was concerned he had a worm. He was seen in [facility] urgent care in [place]. Rectal exam was unremarkable.  Stool for O and P and enteric pathogens were negative. He underwent rectal exam in February by Dr. Shawna Orleans showing internal hemorrhoids. He is originally from [place] and has lived in the [place] 14 years. He travels to [place] about once every year. Denies weight loss, abdominal pain, constipation, diarrhea, change in stool caliber, melena, hematochezia, nausea, vomiting, dysphagia, reflux symptoms, chest pain.  Review of Systems: Pertinent positive and negative review of systems were noted in the above HPI section. All other review of systems were otherwise negative.  Current Medications, Allergies, Past Medical History, Past Surgical History, Family History and Social History were reviewed in [employer] record.  Physical Exam: General: Well developed , well nourished, no acute distress Head: Normocephalic and atraumatic Eyes:  sclerae anicteric, EOMI Ears: Normal auditory acuity Mouth: No deformity or lesions Neck: Supple, no masses or thyromegaly Lungs: Clear throughout to auscultation Heart: Regular rate and rhythm; no murmurs, rubs or bruits Abdomen: Soft, non tender and non distended. No masses, hepatosplenomegaly or hernias noted. Normal Bowel sounds Rectal: deferred to colonoscopy, recent DRE by Dr. Shawna Orleans showed internal hemorrhoids, and DRE last week at [facility] was normal Musculoskeletal: Symmetrical with no gross deformities  Skin: No lesions on visible extremities Pulses:  Normal pulses noted Extremities: No clubbing, cyanosis, edema or deformities noted Neurological: Alert oriented x 4,  grossly nonfocal Cervical Nodes:  No significant cervical adenopathy Inguinal Nodes: No significant inguinal adenopathy Psychological:  Alert and cooperative. Normal mood and affect  Assessment and Recommendations:  1. Lower abdominal and rectal movement sensation with change in bowel habits and rectal itching. Rule out IBS, colorectal neoplasms, colitis and other disorders. Trial of hyoscyamine before meals and as needed. Rectal care instructions and Anusol-HC cream twice a day when necessary for rectal itching The risks, benefits, and alternatives to colonoscopy with possible biopsy, possible destruction of internal hemorrhoids and possible polypectomy were discussed with the patient and they consent to proceed.

## 2013-11-21 ENCOUNTER — Encounter: Payer: Self-pay | Admitting: Gastroenterology

## 2013-11-21 ENCOUNTER — Telehealth: Payer: Self-pay | Admitting: Gastroenterology

## 2013-11-21 ENCOUNTER — Ambulatory Visit (AMBULATORY_SURGERY_CENTER): Payer: No Typology Code available for payment source | Admitting: Gastroenterology

## 2013-11-21 VITALS — BP 150/90 | HR 77 | Temp 97.3°F | Resp 22 | Ht 69.0 in | Wt 289.0 lb

## 2013-11-21 DIAGNOSIS — L290 Pruritus ani: Secondary | ICD-10-CM

## 2013-11-21 DIAGNOSIS — R109 Unspecified abdominal pain: Secondary | ICD-10-CM

## 2013-11-21 DIAGNOSIS — D126 Benign neoplasm of colon, unspecified: Secondary | ICD-10-CM

## 2013-11-21 DIAGNOSIS — R198 Other specified symptoms and signs involving the digestive system and abdomen: Secondary | ICD-10-CM

## 2013-11-21 MED ORDER — SODIUM CHLORIDE 0.9 % IV SOLN: 500.0000 mL | INTRAVENOUS | Status: DC

## 2013-11-21 NOTE — Progress Notes (Signed)
Lidocaine-40mg IV prior to Propofol InductionPropofol given over incremental dosages 

## 2013-11-21 NOTE — Progress Notes (Signed)
Called to room to assist during endoscopic procedure.  Patient ID and intended procedure confirmed with present staff. Received instructions for my participation in the procedure from the performing physician.  

## 2013-11-21 NOTE — Op Note (Addendum)
[facility]  [address]. [place], [address]   COLONOSCOPY PROCEDURE REPORT PATIENT: [name], [name]  MR#: [id number] BIRTHDATE: 1966-08-10 , 47  yrs. old GENDER: Male ENDOSCOPIST: Ladene Artist, MD, FACG REFERRED VO:PFYTWK Yoo, DO PROCEDURE DATE:  11/21/2013 PROCEDURE:   Colonoscopy with snare polypectomy First Screening Colonoscopy - Avg.  risk and is 50 yrs.  old or older - No.  Prior Negative Screening - Now for repeat screening. N/A  History of Adenoma - Now for follow-up colonoscopy & has been > or = to 3 yrs.  N/A  Polyps Removed Today? Yes. ASA CLASS:   Class II INDICATIONS:Abdominal pain and Change in bowel habits. MEDICATIONS: MAC sedation, administered by CRNA and propofol (Diprivan) 250mg  IV DESCRIPTION OF PROCEDURE:   After the risks benefits and alternatives of the procedure were thoroughly explained, informed consent was obtained.  A digital rectal exam revealed no abnormalities of the rectum.   The LB [id number]  endoscope was introduced through the anus and advanced to the cecum, which was identified by both the appendix and ileocecal valve. No adverse events experienced.   The quality of the prep was good, using MoviPrep  The instrument was then slowly withdrawn as the colon was fully examined.  COLON FINDINGS: Two sessile polyps measuring 4-5 mm in size were found in the transverse colon.  A polypectomy was performed with a cold snare.  The resection was complete and the polyp tissue was completely retrieved.   A sessile polyp measuring 6 mm in size was found in the sigmoid colon.  A polypectomy was performed with a cold snare.  The resection was complete and the polyp tissue was completely retrieved.   The colon was otherwise normal.  There was no diverticulosis, inflammation, polyps or cancers unless previously stated.  Retroflexed views revealed internal hemorrhoids. The time to cecum=2 minutes 50 seconds.  Withdrawal time=11 minutes  00 seconds.  The scope was withdrawn and the procedure completed. COMPLICATIONS: There were no complications.  ENDOSCOPIC IMPRESSION: 1.   Two sessile polyps measuring 4-5 mm in the transverse colon; polypectomy performed with a cold snare 2.   Sessile polyp measuring 6 mm in the sigmoid colon; polypectomy performed with a cold snare 3.   Small internal hemorrhoids  RECOMMENDATIONS: 1.  Await pathology results 2.  Repeat colonoscopy in 5 years if polyp(s) adenomatous; otherwise 10 years 3.  High fiber diet and adequate water intake, rectal care instruction 4.  Levsin 1-2 ac and q4h prn and Preparation H supp bid prn  eSigned:  Ladene Artist, MD, [facility] 11/21/2013 10:39 AM

## 2013-11-21 NOTE — Patient Instructions (Signed)
YOU HAD AN ENDOSCOPIC PROCEDURE TODAY AT [facility]: Refer to the procedure report that was given to you for any specific questions about what was found during the examination.  If the procedure report does not answer your questions, please call your gastroenterologist to clarify.  If you requested that your care partner not be given the details of your procedure findings, then the procedure report has been included in a sealed envelope for you to review at your convenience later.  YOU SHOULD EXPECT: Some feelings of bloating in the abdomen. Passage of more gas than usual.  Walking can help get rid of the air that was put into your GI tract during the procedure and reduce the bloating. If you had a lower endoscopy (such as a colonoscopy or flexible sigmoidoscopy) you may notice spotting of blood in your stool or on the toilet paper. If you underwent a bowel prep for your procedure, then you may not have a normal bowel movement for a few days.  DIET: Your first meal following the procedure should be a light meal and then it is ok to progress to your normal diet.  A half-sandwich or bowl of soup is an example of a good first meal.  Heavy or fried foods are harder to digest and may make you feel nauseous or bloated.  Likewise meals heavy in dairy and vegetables can cause extra gas to form and this can also increase the bloating.  Drink plenty of fluids but you should avoid alcoholic beverages for 24 hours.  ACTIVITY: Your care partner should take you home directly after the procedure.  You should plan to take it easy, moving slowly for the rest of the day.  You can resume normal activity the day after the procedure however you should NOT DRIVE or use heavy machinery for 24 hours (because of the sedation medicines used during the test).    SYMPTOMS TO REPORT IMMEDIATELY: A gastroenterologist can be reached at any hour.  During normal business hours, 8:30 AM to 5:00 PM Monday through Friday,  call [phone].  After hours and on weekends, please call the GI answering service at [phone] who will take a message and have the physician on call contact you.   Following lower endoscopy (colonoscopy or flexible sigmoidoscopy):  Excessive amounts of blood in the stool  Significant tenderness or worsening of abdominal pains  Swelling of the abdomen that is new, acute  Fever of 100F or higher  FOLLOW UP: If any biopsies were taken you will be contacted by phone or by letter within the next 1-3 weeks.  Call your gastroenterologist if you have not heard about the biopsies in 3 weeks.  Our staff will call the home number listed on your records the next business day following your procedure to check on you and address any questions or concerns that you may have at that time regarding the information given to you following your procedure. This is a courtesy call and so if there is no answer at the home number and we have not heard from you through the emergency physician on call, we will assume that you have returned to your regular daily activities without incident.  Take levsin 1-2 before meals and every 4 hours, also use preparation H suppositories twice daily as needed.  SIGNATURES/CONFIDENTIALITY: You and/or your care partner have signed paperwork which will be entered into your electronic medical record.  These signatures attest to the fact that that the information above  on your After Visit Summary has been reviewed and is understood.  Full responsibility of the confidentiality of this discharge information lies with you and/or your care-partner.

## 2013-11-22 ENCOUNTER — Telehealth: Payer: Self-pay | Admitting: *Deleted

## 2013-11-22 NOTE — Telephone Encounter (Signed)
  Follow up Call-  Call back number 11/21/2013  Post procedure Call Back phone  # [phone]  Permission to leave phone message Yes     Patient questions:  Do you have a fever, pain , or abdominal swelling? no Pain Score  0 *  Have you tolerated food without any problems? yes  Have you been able to return to your normal activities? yes  Do you have any questions about your discharge instructions: Diet   no Medications  no Follow up visit  no  Do you have questions or concerns about your Care? no  Actions: * If pain score is 4 or above: No action needed, pain <4.

## 2013-12-01 ENCOUNTER — Encounter: Payer: Self-pay | Admitting: Gastroenterology

## 2014-04-03 ENCOUNTER — Encounter: Payer: Self-pay | Admitting: Internal Medicine

## 2014-04-03 ENCOUNTER — Ambulatory Visit (INDEPENDENT_AMBULATORY_CARE_PROVIDER_SITE_OTHER): Payer: BC Managed Care – PPO | Admitting: Internal Medicine

## 2014-04-03 VITALS — BP 156/94 | Temp 98.2°F | Ht 69.0 in | Wt 285.0 lb

## 2014-04-03 DIAGNOSIS — E785 Hyperlipidemia, unspecified: Secondary | ICD-10-CM

## 2014-04-03 DIAGNOSIS — I10 Essential (primary) hypertension: Secondary | ICD-10-CM

## 2014-04-03 DIAGNOSIS — R351 Nocturia: Secondary | ICD-10-CM | POA: Insufficient documentation

## 2014-04-03 LAB — POCT URINALYSIS DIPSTICK
BILIRUBIN UA: NEGATIVE
GLUCOSE UA: NEGATIVE
LEUKOCYTES UA: NEGATIVE
NITRITE UA: NEGATIVE
Spec Grav, UA: 1.02
UROBILINOGEN UA: 0.2
pH, UA: 5.5

## 2014-04-03 MED ORDER — VALSARTAN 160 MG PO TABS: 160.0000 mg | ORAL_TABLET | Freq: Every day | ORAL | Status: DC

## 2014-04-03 MED ORDER — AMLODIPINE BESYLATE 5 MG PO TABS: 5.0000 mg | ORAL_TABLET | Freq: Every day | ORAL | Status: DC

## 2014-04-03 NOTE — Patient Instructions (Signed)
Please complete the following lab tests before your next follow up appointment: BMET - 401.9 A1c - 790.29 FLP, LFTs - 272.4

## 2014-04-03 NOTE — Progress Notes (Signed)
Pre visit review using our clinic review tool, if applicable. No additional management support is needed unless otherwise documented below in the visit note. 

## 2014-04-03 NOTE — Assessment & Plan Note (Signed)
48 year old African male presents with nocturia. Over the last 1 to 2 months there have been occasions where he has to urinate 4-5 times overnight. His UA is negative for glucosuria. He did not have any symptoms of BPH.  Discontinue hydrochlorothiazide. Switch to amlodipine. Patient advised to stop drinking liquids 3 hours before bedtime. Reassess in 2 months.

## 2014-04-03 NOTE — Assessment & Plan Note (Signed)
Continue simvastatin. Monitor fasting lipid panel and LFTs.

## 2014-04-03 NOTE — Assessment & Plan Note (Signed)
Blood pressure is poorly controlled. Discontinue losartan and hydrochlorothiazide. Switch to valsartan 160 mg and amlodipine 5 mg once daily. BP: 156/94 mmHg  Monitor electrolytes and kidney function before next office visit.

## 2014-04-03 NOTE — Progress Notes (Signed)
   Subjective:    Patient ID: [name], male    DOB: [birth year], 48 y.o.   MRN: [id number]  HPI  48 year old African male with history of hypertension and hyperlipidemia complains of nocturia that he has developed over the last one to 2 months. Patient reports he usually gets up twice over night to urinate. However over the last several weeks there have been occassions when he gets up 4-5 times to urinate. He denies any polyuria during the day. He reports his urine stream is still fairly strong. He denies any difficulty starting or stopping his urine flow.   Hypertension he currently takes losartan 100 mg and hydrochlorothiazide 25 mg once daily. He usually takes his diuretic in the morning. He works as a Administrator. Sometimes he goes to sleep between 2 to 3 AM. He tries to drink mostly water and diet soda.  Patient also reports suffering a fall. He fell down some steps. He has mild right low back discomfort. He has abrasion of right upper scalp that is healed.  Review of Systems Negative for polydipsia, negative for dysuria, negative for daytime polyuria.     Past Medical History  Diagnosis Date  . Hypertension   . Hyperlipidemia   . Obesity   . Abnormal glucose   . Carbuncles   . Hypokalemia     History   Social History  . Marital Status: Divorced    Spouse Name: N/A    Number of Children: 3  . Years of Education: N/A   Occupational History  . truck driver    Social History Main Topics  . Smoking status: Current Some Day Smoker    Types: Cigars  . Smokeless tobacco: Never Used  . Alcohol Use: Yes     Comment: occasional  . Drug Use: No  . Sexual Activity: Not on file   Other Topics Concern  . Not on file   Social History Narrative   Regular exercise: no   Works as Administrator    No past surgical history on file.  No family history on file.  Allergies  Allergen Reactions  . Lisinopril     Current Outpatient Prescriptions on File Prior to Visit    Medication Sig Dispense Refill  . hyoscyamine (LEVSIN SL) 0.125 MG SL tablet Take 1-2 tablets by mouth every 4 hours as needed before meals  100 tablet  11  . simvastatin (ZOCOR) 20 MG tablet Take 1 tablet (20 mg total) by mouth every evening.  90 tablet  3   No current facility-administered medications on file prior to visit.    BP 156/94  Temp(Src) 98.2 F (36.8 C) (Oral)  Ht 5\' 9"  (1.753 m)  Wt 285 lb (129.275 kg)  BMI 42.07 kg/m2    Objective:   Physical Exam  Constitutional: He is oriented to person, place, and time. He appears well-developed and well-nourished.  HENT:  Head: Normocephalic and atraumatic.  Healed scar on top of head  Cardiovascular: Normal rate, regular rhythm and normal heart sounds.   No murmur heard. Pulmonary/Chest: Effort normal. He has no wheezes.  Musculoskeletal: He exhibits no edema.  Neurological: He is alert and oriented to person, place, and time. No cranial nerve deficit.  Skin: Skin is warm and dry.  Psychiatric: He has a normal mood and affect. His behavior is normal.          Assessment & Plan:

## 2014-04-05 ENCOUNTER — Ambulatory Visit: Payer: Self-pay | Admitting: Internal Medicine

## 2014-07-25 ENCOUNTER — Encounter: Payer: Self-pay | Admitting: Family Medicine

## 2014-07-25 ENCOUNTER — Ambulatory Visit (INDEPENDENT_AMBULATORY_CARE_PROVIDER_SITE_OTHER): Payer: BC Managed Care – PPO | Admitting: Family Medicine

## 2014-07-25 VITALS — BP 143/82 | HR 84 | Temp 98.0°F | Ht 69.0 in | Wt 291.0 lb

## 2014-07-25 DIAGNOSIS — M7710 Lateral epicondylitis, unspecified elbow: Secondary | ICD-10-CM

## 2014-07-25 DIAGNOSIS — M1990 Unspecified osteoarthritis, unspecified site: Secondary | ICD-10-CM

## 2014-07-25 DIAGNOSIS — Z91048 Other nonmedicinal substance allergy status: Secondary | ICD-10-CM

## 2014-07-25 DIAGNOSIS — Z9109 Other allergy status, other than to drugs and biological substances: Secondary | ICD-10-CM

## 2014-07-25 MED ORDER — DICLOFENAC SODIUM 75 MG PO TBEC: 75.0000 mg | DELAYED_RELEASE_TABLET | Freq: Two times a day (BID) | ORAL | Status: DC

## 2014-07-25 NOTE — Progress Notes (Signed)
   Subjective:    Patient ID: [name], male    DOB: 12/23/1965, 48 y.o.   MRN: [id number]  HPI Here for 2 things. First he has had several months of intermittent pains in both elbows and both wrists. 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 Also he has had several months of a dry cough. No PND or ST.    Review of Systems  Constitutional: Negative.   HENT: Negative.   Eyes: Negative.   Respiratory: Positive for cough. Negative for chest tightness, shortness of breath and wheezing.   Cardiovascular: Negative.   Musculoskeletal: Positive for arthralgias.       Objective:   Physical Exam  Constitutional: He appears well-developed and well-nourished.  Neck: No thyromegaly present.  Cardiovascular: Normal rate, regular rhythm, normal heart sounds and intact distal pulses.   Pulmonary/Chest: Effort normal and breath sounds normal.  Musculoskeletal:  Tender over both lateral epicondyles with full ROM. Both wrists are mildly tender but there is no swelling  Lymphadenopathy:    He has no cervical adenopathy.          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 Try ice packs and Diclofenac bid prn. Try Delsym for cough.

## 2014-07-25 NOTE — Progress Notes (Signed)
Pre visit review using our clinic review tool, if applicable. No additional management support is needed unless otherwise documented below in the visit note. 

## 2014-08-29 ENCOUNTER — Other Ambulatory Visit: Payer: Self-pay | Admitting: Family Medicine

## 2014-09-17 ENCOUNTER — Encounter: Payer: Self-pay | Admitting: Internal Medicine

## 2014-09-17 ENCOUNTER — Other Ambulatory Visit: Payer: Self-pay | Admitting: Internal Medicine

## 2014-09-17 ENCOUNTER — Ambulatory Visit (INDEPENDENT_AMBULATORY_CARE_PROVIDER_SITE_OTHER): Payer: BLUE CROSS/BLUE SHIELD | Admitting: Internal Medicine

## 2014-09-17 VITALS — BP 156/100 | HR 101 | Temp 98.5°F | Resp 20 | Ht 69.0 in | Wt 294.0 lb

## 2014-09-17 DIAGNOSIS — E785 Hyperlipidemia, unspecified: Secondary | ICD-10-CM

## 2014-09-17 DIAGNOSIS — R05 Cough: Secondary | ICD-10-CM

## 2014-09-17 DIAGNOSIS — M79642 Pain in left hand: Secondary | ICD-10-CM | POA: Diagnosis not present

## 2014-09-17 DIAGNOSIS — I10 Essential (primary) hypertension: Secondary | ICD-10-CM

## 2014-09-17 DIAGNOSIS — R053 Chronic cough: Secondary | ICD-10-CM | POA: Insufficient documentation

## 2014-09-17 DIAGNOSIS — M79641 Pain in right hand: Secondary | ICD-10-CM | POA: Diagnosis not present

## 2014-09-17 LAB — RHEUMATOID FACTOR

## 2014-09-17 MED ORDER — SIMVASTATIN 20 MG PO TABS: 20.0000 mg | ORAL_TABLET | Freq: Every evening | ORAL | Status: DC

## 2014-09-17 MED ORDER — DOXYCYCLINE HYCLATE 100 MG PO TABS: 100.0000 mg | ORAL_TABLET | Freq: Two times a day (BID) | ORAL | Status: DC

## 2014-09-17 MED ORDER — SULINDAC 150 MG PO TABS: 150.0000 mg | ORAL_TABLET | Freq: Two times a day (BID) | ORAL | Status: DC

## 2014-09-17 MED ORDER — VALSARTAN 160 MG PO TABS: 160.0000 mg | ORAL_TABLET | Freq: Every day | ORAL | Status: DC

## 2014-09-17 NOTE — Progress Notes (Signed)
   Subjective:    Patient ID: [name], male    DOB: 05-26-1966, 49 y.o.   MRN: [id number]  HPI  49 year old African male with history of hypertension and hyperlipidemia for follow-up. Patient reports she was seen on 07/25/2014 secondary to complaints of bilateral hand pain and elbow pain. He was seen by Dr. Sarajane Jews. Patient's symptoms presumed secondary to overuse/osteoarthritis. He was prescribed Voltaren 75 mg once daily. Patient reports taking medication as prescribed for 1 month. Patient reports no improvement in his symptoms.  Patient also complains of intermittent cough and nasal congestion for over 2 weeks. His cough is productive of yellowish sputum. He also has associated sore throat. He denies fever or chills. He denies shortness of breath.  Hypertension-patient only taking amlodipine.  It has been 1-2 months since he last took valsartan.    Review of Systems Patient denies symptoms of heartburn, no family history of autoimmune arthritis    Past Medical History  Diagnosis Date  . Hypertension   . Hyperlipidemia   . Obesity   . Abnormal glucose   . Carbuncles   . Hypokalemia     History   Social History  . Marital Status: Divorced    Spouse Name: N/A    Number of Children: 3  . Years of Education: N/A   Occupational History  . truck driver    Social History Main Topics  . Smoking status: Current Some Day Smoker    Types: Cigars  . Smokeless tobacco: Never Used  . Alcohol Use: 0.0 oz/week    0 Not specified per week     Comment: occ  . Drug Use: No  . Sexual Activity: Not on file   Other Topics Concern  . Not on file   Social History Narrative   Regular exercise: no   Works as Administrator    No past surgical history on file.  No family history on file.  Allergies  Allergen Reactions  . Lisinopril     Current Outpatient Prescriptions on File Prior to Visit  Medication Sig Dispense Refill  . amLODipine (NORVASC) 5 MG tablet Take 1 tablet (5 mg  total) by mouth daily. 90 tablet 1  . hyoscyamine (LEVSIN SL) 0.125 MG SL tablet Take 1-2 tablets by mouth every 4 hours as needed before meals (Patient not taking: Reported on 07/25/2014) 100 tablet 11   No current facility-administered medications on file prior to visit.    BP 156/100 mmHg  Pulse 101  Temp(Src) 98.5 F (36.9 C) (Oral)  Resp 20  Ht 5\' 9"  (1.753 m)  Wt 294 lb (133.358 kg)  BMI 43.40 kg/m2  SpO2 97%    Objective:   Physical Exam  Constitutional: He is oriented to person, place, and time. He appears well-developed and well-nourished.  HENT:  Head: Normocephalic and atraumatic.  Right Ear: External ear normal.  Left Ear: External ear normal.  Crowded oropharynx  Cardiovascular: Normal rate, regular rhythm and normal heart sounds.   No murmur heard. Pulmonary/Chest: Effort normal and breath sounds normal. He has no wheezes.  Musculoskeletal: Normal range of motion. He exhibits no edema.  Bilateral hand exam - no redness, warmth or joint swelling  Neurological: He is alert and oriented to person, place, and time.  Skin: Skin is warm and dry.  Psychiatric: He has a normal mood and affect. His behavior is normal.          Assessment & Plan:

## 2014-09-17 NOTE — Assessment & Plan Note (Signed)
BP poorly controlled.  Patient understands he requires combination therapy with valsartan and amlodipine. BP: (!) 156/100 mmHg

## 2014-09-17 NOTE — Assessment & Plan Note (Signed)
49 year old African male with intermittent productive cough for greater than 2 weeks. I suspect patient has tracheitis/bronchitis.  Treat with doxycycline 100 mg bid x 10 days.

## 2014-09-17 NOTE — Assessment & Plan Note (Addendum)
Patient complains of bilateral hand pain since late November 2015. His symptoms refractory to nonsteroidal anti-inflammatories. I doubt his symptoms from rheumatoid arthritis. Obtain sedimentation rate, ANA and rheumatoid factor. Obtain bilateral hand x-rays. Switch to Clinoril 150 mg twice daily. Reactive arthritis is a consideration. If no improvement with NSAIDs, consider short course of prednisone. Reassess in 1 month.  Addendum:  09/18/2014 9:00 AM Contacted and spoke with patient.  He was advised not to start Clinoril (Sulindac) but instead use prednisone taper as directed.  Patient had no improvement with previous trial of NSAIDs.  He expresses understanding and agrees to take prednisone as directed.

## 2014-09-17 NOTE — Progress Notes (Signed)
Pre visit review using our clinic review tool, if applicable. No additional management support is needed unless otherwise documented below in the visit note. 

## 2014-09-18 LAB — BASIC METABOLIC PANEL
BUN: 13 mg/dL (ref 6–23)
CHLORIDE: 104 meq/L (ref 96–112)
CO2: 25 meq/L (ref 19–32)
Calcium: 9.6 mg/dL (ref 8.4–10.5)
Creatinine, Ser: 0.96 mg/dL (ref 0.40–1.50)
GFR: 106.36 mL/min (ref >60.00)
Glucose, Bld: 125 mg/dL — ABNORMAL HIGH (ref 70–99)
POTASSIUM: 4.1 meq/L (ref 3.5–5.1)
SODIUM: 136 meq/L (ref 135–145)

## 2014-09-18 LAB — SEDIMENTATION RATE: Sed Rate: 26 mm/hr — ABNORMAL HIGH (ref 0–22)

## 2014-09-18 LAB — URIC ACID: Uric Acid, Serum: 5.1 mg/dL (ref 4.0–7.8)

## 2014-09-18 LAB — ANA: ANA: NEGATIVE

## 2014-09-18 MED ORDER — PREDNISONE 10 MG PO TABS: ORAL_TABLET | ORAL | Status: DC

## 2014-09-18 NOTE — Addendum Note (Signed)
Addended by: Rosine Abe on: 09/18/2014 09:03 AM   Modules accepted: Orders, Medications, SmartSet

## 2014-09-18 NOTE — Addendum Note (Signed)
Addended by: Rosine Abe on: 09/18/2014 08:33 AM   Modules accepted: Miquel Dunn

## 2014-10-01 ENCOUNTER — Other Ambulatory Visit: Payer: Self-pay | Admitting: Internal Medicine

## 2014-10-19 ENCOUNTER — Ambulatory Visit (INDEPENDENT_AMBULATORY_CARE_PROVIDER_SITE_OTHER)
Admission: RE | Admit: 2014-10-19 | Discharge: 2014-10-19 | Disposition: A | Discharge status: Home / Self Care | Payer: BLUE CROSS/BLUE SHIELD | Source: Ambulatory Visit | Attending: Internal Medicine | Admitting: Internal Medicine

## 2014-10-19 DIAGNOSIS — M79642 Pain in left hand: Secondary | ICD-10-CM | POA: Diagnosis not present

## 2014-10-19 DIAGNOSIS — M79641 Pain in right hand: Secondary | ICD-10-CM | POA: Diagnosis not present

## 2014-10-26 ENCOUNTER — Telehealth: Payer: Self-pay | Admitting: Internal Medicine

## 2014-10-26 NOTE — Telephone Encounter (Signed)
Can you find out his chief complaint?  Why does he want to be seen today?  Depending on problem, we can try to fit him in at 1 pm?

## 2014-10-26 NOTE — Telephone Encounter (Signed)
Nothing acute.  Scheduled for 11/14/14 at 2:00 pm

## 2014-10-26 NOTE — Telephone Encounter (Signed)
Pt would like to know if Dr Shawna Orleans could fit him in. He only want to see Dr  Shawna Orleans.

## 2014-11-14 ENCOUNTER — Ambulatory Visit (INDEPENDENT_AMBULATORY_CARE_PROVIDER_SITE_OTHER): Payer: BLUE CROSS/BLUE SHIELD | Admitting: Internal Medicine

## 2014-11-14 ENCOUNTER — Encounter: Payer: Self-pay | Admitting: Internal Medicine

## 2014-11-14 VITALS — BP 124/90 | HR 82 | Temp 98.8°F | Ht 69.0 in | Wt 287.0 lb

## 2014-11-14 DIAGNOSIS — M79641 Pain in right hand: Secondary | ICD-10-CM

## 2014-11-14 DIAGNOSIS — E785 Hyperlipidemia, unspecified: Secondary | ICD-10-CM

## 2014-11-14 DIAGNOSIS — M79642 Pain in left hand: Secondary | ICD-10-CM

## 2014-11-14 DIAGNOSIS — I10 Essential (primary) hypertension: Secondary | ICD-10-CM

## 2014-11-14 MED ORDER — VALSARTAN 160 MG PO TABS: 160.0000 mg | ORAL_TABLET | Freq: Every day | ORAL | Status: DC

## 2014-11-14 MED ORDER — DICLOFENAC SODIUM 1 % TD GEL: 2.0000 g | Freq: Four times a day (QID) | TRANSDERMAL | Status: DC

## 2014-11-14 MED ORDER — SIMVASTATIN 20 MG PO TABS: 20.0000 mg | ORAL_TABLET | Freq: Every evening | ORAL | Status: DC

## 2014-11-14 MED ORDER — AMLODIPINE BESYLATE 5 MG PO TABS: 5.0000 mg | ORAL_TABLET | Freq: Every day | ORAL | Status: DC

## 2014-11-14 NOTE — Progress Notes (Signed)
Pre visit review using our clinic review tool, if applicable. No additional management support is needed unless otherwise documented below in the visit note. 

## 2014-11-14 NOTE — Assessment & Plan Note (Addendum)
BP improved.  Continue combination therapy.  BP: 124/90 mmHg

## 2014-11-14 NOTE — Progress Notes (Signed)
   Subjective:    Patient ID: [name], male    DOB: [birth year], 49 y.o.   MRN: [id number]  HPI  49 year old African male with hypertension and hand pain for follow-up.  Hand x-ray which showed mild osteoarthritis/degenerative changes of left hand. Right hand x-ray was unremarkable. Blood work not suggestive of inflammatory arthritis.  Overall hand pain has improved. He has mild persistent symptoms lateral aspect of left hand and wrist.  Hypertension-stable  Review of Systems Negative for chest pain.      Past Medical History  Diagnosis Date  . Hypertension   . Hyperlipidemia   . Obesity   . Abnormal glucose   . Carbuncles   . Hypokalemia     History   Social History  . Marital Status: Divorced    Spouse Name: N/A  . Number of Children: 3  . Years of Education: N/A   Occupational History  . truck driver    Social History Main Topics  . Smoking status: Current Some Day Smoker    Types: Cigars  . Smokeless tobacco: Never Used  . Alcohol Use: 0.0 oz/week    0 Standard drinks or equivalent per week     Comment: occ  . Drug Use: No  . Sexual Activity: Not on file   Other Topics Concern  . Not on file   Social History Narrative   Regular exercise: no   Works as Administrator    No past surgical history on file.  No family history on file.  Allergies  Allergen Reactions  . Lisinopril     Current Outpatient Prescriptions on File Prior to Visit  Medication Sig Dispense Refill  . amLODipine (NORVASC) 5 MG tablet TAKE 1 TABLET BY MOUTH EVERY DAY 90 tablet 1  . simvastatin (ZOCOR) 20 MG tablet Take 1 tablet (20 mg total) by mouth every evening. 90 tablet 1  . valsartan (DIOVAN) 160 MG tablet Take 1 tablet (160 mg total) by mouth daily. 90 tablet 1   No current facility-administered medications on file prior to visit.    BP 124/90 mmHg  Pulse 82  Temp(Src) 98.8 F (37.1 C) (Oral)  Ht 5\' 9"  (1.753 m)  Wt 287 lb (130.182 kg)  BMI 42.36  kg/m2    Objective:   Physical Exam  Constitutional: He is oriented to person, place, and time. He appears well-developed and well-nourished.  Cardiovascular: Normal rate, regular rhythm and normal heart sounds.   Pulmonary/Chest: Effort normal and breath sounds normal. He has no wheezes.  Musculoskeletal:  Mild tenderness over left fifth metacarpal joint and ulnar aspect of left wrist.  No redness or swelling  Neurological: He is alert and oriented to person, place, and time.  Psychiatric: He has a normal mood and affect. His behavior is normal.          Assessment & Plan:

## 2014-11-14 NOTE — Patient Instructions (Signed)
Please schedule next office visit as CPX CPX labs including PSA before next visit

## 2014-11-14 NOTE — Assessment & Plan Note (Signed)
Workup for inflammatory arthritis negative. Left hand x-ray showed mild degenerative changes.  Right hand x-ray was normal. He continues to have intermittent mild symptoms located near fifth metacarpal joint and ulnar aspect of left wrist. Patient advised to use Voltaren gel 4 times a day for 1-2 weeks.

## 2015-08-14 ENCOUNTER — Other Ambulatory Visit: Payer: Self-pay | Admitting: Internal Medicine

## 2015-11-01 ENCOUNTER — Other Ambulatory Visit: Payer: Self-pay | Admitting: Internal Medicine

## 2015-11-01 ENCOUNTER — Encounter: Payer: Self-pay | Admitting: Family Medicine

## 2015-11-01 NOTE — Progress Notes (Signed)
This encounter was created in error - please disregard.

## 2015-11-06 ENCOUNTER — Ambulatory Visit: Payer: Self-pay | Admitting: Family Medicine

## 2015-11-07 ENCOUNTER — Ambulatory Visit: Payer: Self-pay | Admitting: Family Medicine

## 2015-11-11 ENCOUNTER — Ambulatory Visit (INDEPENDENT_AMBULATORY_CARE_PROVIDER_SITE_OTHER): Payer: BLUE CROSS/BLUE SHIELD | Admitting: Family Medicine

## 2015-11-11 ENCOUNTER — Encounter: Payer: Self-pay | Admitting: Family Medicine

## 2015-11-11 DIAGNOSIS — I10 Essential (primary) hypertension: Secondary | ICD-10-CM

## 2015-11-11 DIAGNOSIS — R739 Hyperglycemia, unspecified: Secondary | ICD-10-CM

## 2015-11-11 DIAGNOSIS — E785 Hyperlipidemia, unspecified: Secondary | ICD-10-CM

## 2015-11-11 LAB — BASIC METABOLIC PANEL
BUN: 12 mg/dL (ref 6–23)
CALCIUM: 9.1 mg/dL (ref 8.4–10.5)
CO2: 24 mEq/L (ref 19–32)
CREATININE: 0.82 mg/dL (ref 0.40–1.50)
Chloride: 105 mEq/L (ref 96–112)
GFR: 127 mL/min (ref >60.00)
Glucose, Bld: 140 mg/dL — ABNORMAL HIGH (ref 70–99)
Potassium: 3.8 mEq/L (ref 3.5–5.1)
Sodium: 138 mEq/L (ref 135–145)

## 2015-11-11 LAB — HEPATIC FUNCTION PANEL
ALBUMIN: 4.4 g/dL (ref 3.5–5.2)
ALT: 22 U/L (ref 0–53)
AST: 23 U/L (ref 0–37)
Alkaline Phosphatase: 55 U/L (ref 39–117)
BILIRUBIN TOTAL: 0.7 mg/dL (ref 0.2–1.2)
Bilirubin, Direct: 0.1 mg/dL (ref 0.0–0.3)
Total Protein: 7.3 g/dL (ref 6.0–8.3)

## 2015-11-11 LAB — LIPID PANEL
CHOL/HDL RATIO: 4
CHOLESTEROL: 213 mg/dL — AB (ref 0–200)
HDL: 48.7 mg/dL (ref >39.00)
LDL CALC: 146 mg/dL — AB (ref 0–99)
NonHDL: 164.06
Triglycerides: 92 mg/dL (ref 0.0–149.0)
VLDL: 18.4 mg/dL (ref 0.0–40.0)

## 2015-11-11 LAB — HEMOGLOBIN A1C: HEMOGLOBIN A1C: 6.5 % (ref 4.6–6.5)

## 2015-11-11 MED ORDER — VALSARTAN 160 MG PO TABS: 160.0000 mg | ORAL_TABLET | Freq: Every day | ORAL | Status: DC

## 2015-11-11 MED ORDER — SIMVASTATIN 20 MG PO TABS: 20.0000 mg | ORAL_TABLET | Freq: Every day | ORAL | Status: DC

## 2015-11-11 MED ORDER — AMLODIPINE BESYLATE 5 MG PO TABS: 5.0000 mg | ORAL_TABLET | Freq: Every day | ORAL | Status: DC

## 2015-11-11 NOTE — Progress Notes (Signed)
Pre visit review using our clinic review tool, if applicable. No additional management support is needed unless otherwise documented below in the visit note. 

## 2015-11-11 NOTE — Progress Notes (Signed)
   Subjective:    Patient ID: [name], male    DOB: 04-12-1966, 50 y.o.   MRN: [id number]  HPI  Patient seen for medical follow-up in absence of his usual primary medical provider  His chronic problems include history of obesity, hypertension, dyslipidemia, hyperglycemia, and nicotine use.  Works as a Administrator. Very little exercise. Tries to watch his sodium intake.  Does not monitor blood pressures regularly. Compliant with medications.  No headaches. No dizziness. Denies chest pains.   Reports his mother died of heart disease age 71. Patient had stress test years ago unremarkable. He's not had any recent chest pains whatsoever.  Past Medical History  Diagnosis Date  . Hypertension   . Hyperlipidemia   . Obesity   . Abnormal glucose   . Carbuncles   . Hypokalemia    No past surgical history on file.  reports that he has been smoking Cigars.  He has never used smokeless tobacco. He reports that he drinks alcohol. He reports that he does not use illicit drugs. family history is not on file. Allergies  Allergen Reactions  . Lisinopril       Review of Systems  Constitutional: Negative for fever, activity change, appetite change, fatigue and unexpected weight change.  HENT: Negative for congestion, ear pain and trouble swallowing.   Eyes: Negative for pain and visual disturbance.  Respiratory: Negative for cough, shortness of breath and wheezing.   Cardiovascular: Negative for chest pain and palpitations.  Gastrointestinal: Negative for nausea, vomiting, abdominal pain, diarrhea, constipation, blood in stool, abdominal distention and rectal pain.  Endocrine: Negative for polydipsia and polyuria.  Genitourinary: Negative for dysuria, hematuria and testicular pain.  Musculoskeletal: Negative for joint swelling and arthralgias.  Skin: Negative for rash.  Neurological: Negative for dizziness, syncope and headaches.  Hematological: Negative for adenopathy.    Psychiatric/Behavioral: Negative for confusion and dysphoric mood.       Objective:   Physical Exam  Constitutional: He is oriented to person, place, and time. He appears well-developed and well-nourished.  HENT:  Right Ear: External ear normal.  Left Ear: External ear normal.  Mouth/Throat: Oropharynx is clear and moist.  Eyes: Pupils are equal, round, and reactive to light.  Neck: Neck supple. No thyromegaly present.  Cardiovascular: Normal rate and regular rhythm.   Pulmonary/Chest: Effort normal and breath sounds normal. No respiratory distress. He has no wheezes. He has no rales.  Musculoskeletal: He exhibits no edema.  Neurological: He is alert and oriented to person, place, and time.          Assessment & Plan:   #1 hypertension. Borderline control. We discussed additional medications versus weight loss and lifestyle modification and he prefers the latter. Recommend routine follow-up within 6 months to reassess. Refilled medications for one year. Check basic metabolic panel   #2 hyperlipidemia. Refill simvastatin for 1 year. Check lipid and hepatic panel   #3 history of hyperglycemia. Encouraged to lose weight as above. Check fasting glucose along with hemoglobin A1c

## 2015-11-11 NOTE — Patient Instructions (Signed)
We refilled all your medications today Try to lose some weight.

## 2015-11-14 MED ORDER — SIMVASTATIN 40 MG PO TABS: 40.0000 mg | ORAL_TABLET | Freq: Every day | ORAL | Status: DC

## 2015-11-14 NOTE — Addendum Note (Signed)
Addended by: Elio Forget on: 11/14/2015 01:12 PM   Modules accepted: Orders, Medications

## 2015-11-26 ENCOUNTER — Other Ambulatory Visit: Payer: Self-pay | Admitting: Internal Medicine

## 2016-06-09 ENCOUNTER — Ambulatory Visit (INDEPENDENT_AMBULATORY_CARE_PROVIDER_SITE_OTHER): Payer: BLUE CROSS/BLUE SHIELD | Admitting: Family Medicine

## 2016-06-09 ENCOUNTER — Encounter: Payer: Self-pay | Admitting: Family Medicine

## 2016-06-09 VITALS — BP 138/96 | HR 97 | Temp 98.0°F | Ht 69.0 in | Wt 273.1 lb

## 2016-06-09 DIAGNOSIS — M5416 Radiculopathy, lumbar region: Secondary | ICD-10-CM

## 2016-06-09 MED ORDER — PREDNISONE 10 MG PO TABS: ORAL_TABLET | ORAL | 0 refills | Status: DC

## 2016-06-09 NOTE — Patient Instructions (Signed)

## 2016-06-09 NOTE — Progress Notes (Signed)
Pre visit review using our clinic review tool, if applicable. No additional management support is needed unless otherwise documented below in the visit note. 

## 2016-06-09 NOTE — Progress Notes (Signed)
Subjective:     Patient ID: [name], male   DOB: Feb 24, 1966, 50 y.o.   MRN: [id number]  HPI Patient seen with slightly over one month history of left lumbar back pain radiating all the way to the foot at times. No known injury. Works as a Administrator. He thinks he might have some mild weakness left lower extremity and occasional numbness dorsum left foot. Went to urgent care several weeks ago and apparently prescribed prednisone and pain went from 7/10 to around 3/10. Pain has now returned to level of 7/10. Pain is worse standing. No alleviating factors other than above. Denies any prior history of back surgery. No urine or stool incontinence. No fevers or chills. No dysuria. No appetite or weight changes.  Past Medical History:  Diagnosis Date  . Abnormal glucose   . Carbuncles   . Hyperlipidemia   . Hypertension   . Hypokalemia   . Obesity    No past surgical history on file.  reports that he has been smoking Cigars.  He has never used smokeless tobacco. He reports that he drinks alcohol. He reports that he does not use drugs. family history is not on file. Allergies  Allergen Reactions  . Lisinopril      Review of Systems  Constitutional: Negative for activity change, appetite change, chills, fever and unexpected weight change.  Respiratory: Negative for cough and shortness of breath.   Cardiovascular: Negative for chest pain and leg swelling.  Gastrointestinal: Negative for abdominal pain and vomiting.  Genitourinary: Negative for dysuria, flank pain and hematuria.  Musculoskeletal: Positive for back pain. Negative for joint swelling.  Neurological: Positive for numbness.       Objective:   Physical Exam  Constitutional: He is oriented to person, place, and time. He appears well-developed and well-nourished. No distress.  Neck: No thyromegaly present.  Cardiovascular: Normal rate, regular rhythm and normal heart sounds.   No murmur heard. Pulmonary/Chest: Effort normal  and breath sounds normal. No respiratory distress. He has no wheezes. He has no rales.  Musculoskeletal: He exhibits no edema.  Neurological: He is alert and oriented to person, place, and time. No cranial nerve deficit.  He has only trace reflexes knee and ankle bilaterally Full strength throughout  Skin: No rash noted.       Assessment:     Left lumbar radiculitis.  Nonfocal exam neurologically    Plan:     -We'll try one more prednisone taper and monitor blood glucose closely -Avoid heavy lifting or frequent squatting or bending -Touch base if pain not resolving with the above. May need MRI scan to further assess.  Eulas Post MD [facility]

## 2016-06-16 ENCOUNTER — Telehealth: Payer: Self-pay | Admitting: Family Medicine

## 2016-06-16 NOTE — Telephone Encounter (Signed)
[facility] - Client [place] Call Center     Patient Name: Kollen [facility] Initial Comment Caller states he saw last week. He is having back pain that goes into legs, was rx meds, helping some. Today left leg is numb.  DOB: Jan 29, 1966      Nurse Assessment  Nurse: Venetia Maxon, RN, Manuela Schwartz Date/Time (Eastern Time): 06/16/2016 4:26:33 PM  Confirm and document reason for call. If symptomatic, describe symptoms. You must click the next button to save text entered. ---Caller states he saw last week for this same problem He is having back pain that goes into legs, was rx meds, helping some. Today left leg is numb. oral prednisone. The symptoms are worse now pain is in back of left leg. no fever  Has the patient traveled out of the country within the last 30 days? ---No  Does the patient have any new or worsening symptoms? ---Yes  Will a triage be completed? ---Yes  Related visit to physician within the last 2 weeks? ---Yes  Does the PT have any chronic conditions? (i.e. diabetes, asthma, etc.) ---Yes  List chronic conditions. ---HTN  Is this a behavioral health or substance abuse call? ---No    Guidelines     Guideline Title Affirmed Question Affirmed Notes   Back Pain Numbness in a leg or foot (i.e., loss of sensation)    Final Disposition User   See Physician within 24 Hours Whitaker, RN, Manuela Schwartz     Referrals   [place] - SPECIFY   Disagree/Comply: Comply

## 2016-06-17 NOTE — Telephone Encounter (Signed)
Spoke with pt and he does not want to schedule an appt for this week. He will call back on Monday to schedule something for next week. Nothing further needed at this time.

## 2016-06-19 ENCOUNTER — Ambulatory Visit: Payer: BLUE CROSS/BLUE SHIELD | Admitting: Adult Health

## 2016-06-22 ENCOUNTER — Ambulatory Visit (INDEPENDENT_AMBULATORY_CARE_PROVIDER_SITE_OTHER): Payer: BLUE CROSS/BLUE SHIELD | Admitting: Adult Health

## 2016-06-22 DIAGNOSIS — R200 Anesthesia of skin: Secondary | ICD-10-CM | POA: Diagnosis not present

## 2016-06-22 DIAGNOSIS — M5442 Lumbago with sciatica, left side: Secondary | ICD-10-CM

## 2016-06-22 DIAGNOSIS — G8929 Other chronic pain: Secondary | ICD-10-CM | POA: Diagnosis not present

## 2016-06-22 NOTE — Progress Notes (Signed)
See scanned chart that was done during down time.   Will order MRI of lumbar spine to r/o bulging disk, or stenosis  - Paper script for Percocet 10 mg QHS, 30 pills, 0 refills given   - Follow up if no improvement  Dorothyann Peng, NP

## 2016-07-04 ENCOUNTER — Ambulatory Visit
Admission: RE | Admit: 2016-07-04 | Discharge: 2016-07-04 | Disposition: A | Discharge status: Home / Self Care | Payer: BLUE CROSS/BLUE SHIELD | Source: Ambulatory Visit | Attending: Adult Health | Admitting: Adult Health

## 2016-07-04 DIAGNOSIS — R200 Anesthesia of skin: Secondary | ICD-10-CM

## 2016-07-04 DIAGNOSIS — M5442 Lumbago with sciatica, left side: Principal | ICD-10-CM

## 2016-07-04 DIAGNOSIS — G8929 Other chronic pain: Secondary | ICD-10-CM

## 2016-07-20 ENCOUNTER — Encounter: Payer: Self-pay | Admitting: Family Medicine

## 2016-07-20 ENCOUNTER — Other Ambulatory Visit: Payer: Self-pay

## 2016-07-20 ENCOUNTER — Other Ambulatory Visit: Payer: Self-pay | Admitting: Family Medicine

## 2016-07-20 ENCOUNTER — Ambulatory Visit (INDEPENDENT_AMBULATORY_CARE_PROVIDER_SITE_OTHER): Payer: BLUE CROSS/BLUE SHIELD | Admitting: Family Medicine

## 2016-07-20 ENCOUNTER — Telehealth: Payer: Self-pay | Admitting: Family Medicine

## 2016-07-20 VITALS — BP 150/90 | HR 90 | Temp 98.1°F | Ht 69.0 in | Wt 275.8 lb

## 2016-07-20 DIAGNOSIS — Z7289 Other problems related to lifestyle: Secondary | ICD-10-CM

## 2016-07-20 DIAGNOSIS — M5432 Sciatica, left side: Secondary | ICD-10-CM

## 2016-07-20 DIAGNOSIS — R319 Hematuria, unspecified: Secondary | ICD-10-CM | POA: Diagnosis not present

## 2016-07-20 DIAGNOSIS — I10 Essential (primary) hypertension: Secondary | ICD-10-CM

## 2016-07-20 DIAGNOSIS — Z0001 Encounter for general adult medical examination with abnormal findings: Secondary | ICD-10-CM

## 2016-07-20 DIAGNOSIS — E785 Hyperlipidemia, unspecified: Secondary | ICD-10-CM | POA: Diagnosis not present

## 2016-07-20 DIAGNOSIS — R7309 Other abnormal glucose: Secondary | ICD-10-CM | POA: Diagnosis not present

## 2016-07-20 DIAGNOSIS — R3129 Other microscopic hematuria: Secondary | ICD-10-CM

## 2016-07-20 DIAGNOSIS — H66002 Acute suppurative otitis media without spontaneous rupture of ear drum, left ear: Secondary | ICD-10-CM | POA: Diagnosis not present

## 2016-07-20 LAB — CBC
HCT: 41.2 % (ref 39.0–52.0)
Hemoglobin: 13.3 g/dL (ref 13.0–17.0)
MCHC: 32.1 g/dL (ref 30.0–36.0)
MCV: 83.1 fl (ref 78.0–100.0)
Platelets: 272 10*3/uL (ref 150.0–400.0)
RBC: 4.96 Mil/uL (ref 4.22–5.81)
RDW: 13.9 % (ref 11.5–15.5)
WBC: 8.1 10*3/uL (ref 4.0–10.5)

## 2016-07-20 LAB — COMPREHENSIVE METABOLIC PANEL
ALBUMIN: 3.9 g/dL (ref 3.5–5.2)
ALT: 29 U/L (ref 0–53)
AST: 23 U/L (ref 0–37)
Alkaline Phosphatase: 61 U/L (ref 39–117)
BUN: 12 mg/dL (ref 6–23)
CHLORIDE: 108 meq/L (ref 96–112)
CO2: 22 mEq/L (ref 19–32)
CREATININE: 0.74 mg/dL (ref 0.40–1.50)
Calcium: 9.1 mg/dL (ref 8.4–10.5)
GFR: 142.59 mL/min (ref >60.00)
GLUCOSE: 118 mg/dL — AB (ref 70–99)
Potassium: 4.3 mEq/L (ref 3.5–5.1)
SODIUM: 143 meq/L (ref 135–145)
TOTAL PROTEIN: 6.9 g/dL (ref 6.0–8.3)
Total Bilirubin: 0.6 mg/dL (ref 0.2–1.2)

## 2016-07-20 LAB — POC URINALSYSI DIPSTICK (AUTOMATED)
Bilirubin, UA: NEGATIVE
Glucose, UA: NEGATIVE
KETONES UA: NEGATIVE
LEUKOCYTES UA: NEGATIVE
NITRITE UA: NEGATIVE
PH UA: 5.5
Spec Grav, UA: >=1.03
UROBILINOGEN UA: 1

## 2016-07-20 LAB — PSA: PSA: 1.48 ng/mL (ref 0.10–4.00)

## 2016-07-20 LAB — URINALYSIS, MICROSCOPIC ONLY: WBC UA: NONE SEEN (ref 0–?)

## 2016-07-20 LAB — HEMOGLOBIN A1C: HEMOGLOBIN A1C: 6.4 % (ref 4.6–6.5)

## 2016-07-20 LAB — LIPID PANEL
CHOLESTEROL: 211 mg/dL — AB (ref 0–200)
HDL: 58.9 mg/dL (ref >39.00)
LDL CALC: 137 mg/dL — AB (ref 0–99)
NONHDL: 151.95
Total CHOL/HDL Ratio: 4
Triglycerides: 75 mg/dL (ref 0.0–149.0)
VLDL: 15 mg/dL (ref 0.0–40.0)

## 2016-07-20 MED ORDER — AMLODIPINE BESYLATE 10 MG PO TABS: 10.0000 mg | ORAL_TABLET | Freq: Every day | ORAL | 3 refills | Status: AC

## 2016-07-20 MED ORDER — AMOXICILLIN 500 MG PO CAPS: 500.0000 mg | ORAL_CAPSULE | Freq: Two times a day (BID) | ORAL | 0 refills | Status: DC

## 2016-07-20 MED ORDER — DICLOFENAC SODIUM 1 % TD GEL: 2.0000 g | Freq: Four times a day (QID) | TRANSDERMAL | 1 refills | Status: AC

## 2016-07-20 MED ORDER — ATORVASTATIN CALCIUM 40 MG PO TABS: 40.0000 mg | ORAL_TABLET | Freq: Every day | ORAL | 3 refills | Status: AC

## 2016-07-20 NOTE — Progress Notes (Signed)
Pre visit review using our clinic review tool, if applicable. No additional management support is needed unless otherwise documented below in the visit note. 

## 2016-07-20 NOTE — Telephone Encounter (Signed)
°  Pt ask he can have a refill    Pt request refill of the following:     diclofenac sodium (VOLTAREN) 1 % GEL     Phamacy:   [facility]

## 2016-07-20 NOTE — Assessment & Plan Note (Signed)
S: poorly controlled on simvastatin 40mg . No myalgias.  Lab Results  Component Value Date   CHOL 211 (H) 07/20/2016   HDL 58.90 07/20/2016   LDLCALC 137 (H) 07/20/2016   LDLDIRECT 120.3 01/31/2013   TRIG 75.0 07/20/2016   CHOLHDL 4 07/20/2016   A/P: needed to change due to amlodipine anyway- changed to atorvastatin 40mg - will hope this improves LDL control with goal at least unde r100, 70 more ideal

## 2016-07-20 NOTE — Telephone Encounter (Signed)
Prescription sent to pharmacy as requested.

## 2016-07-20 NOTE — Progress Notes (Signed)
Phone: [phone]  Subjective:  Patient presents today to establish care with me as their new primary care provider- he has several acute concerns. He would also like me to complete a CPE today. Patient was formerly a patient of Dr. Shawna Orleans. Chief complaint-noted.   See problem oriented charting ROS- complete ROS completed and negative including No chest pain or shortness of breath. No headache or blurry vision.  With exception does have some bilateral ear pain L>R  The following were reviewed and entered/updated in epic: Past Medical History:  Diagnosis Date  . Abnormal glucose   . Carbuncles   . Hyperlipidemia   . Hypertension   . Hypokalemia   . Obesity    Patient Active Problem List   Diagnosis Date Noted  . Morbid obesity ([place]) 07/20/2016    Priority: High  . Microscopic hematuria 05/13/2012    Priority: High  . TOBACCO ABUSE 09/25/2008    Priority: High  . Bilateral hand pain 09/17/2014    Priority: Medium  . Nocturia more than twice per night 04/03/2014    Priority: Medium  . HYPERGLYCEMIA 11/13/2009    Priority: Medium  . Hyperlipidemia 03/21/2007    Priority: Medium  . Essential hypertension 03/21/2007    Priority: Medium  . Internal hemorrhoids 10/04/2013    Priority: Low  . Headache(784.0) 08/02/2013    Priority: Low  . GERD 03/21/2007    Priority: Low   Past Surgical History:  Procedure Laterality Date  . none      Family History  Problem Relation Age of Onset  . Heart attack Mother     age 20  . Other Father     41- unknown cause    Medications- reviewed and updated Current Outpatient Prescriptions  Medication Sig Dispense Refill  . amLODipine (NORVASC) 5 MG tablet TAKE 1 TABLET (5 MG TOTAL) BY MOUTH DAILY. 90 tablet 1  . diclofenac sodium (VOLTAREN) 1 % GEL Apply topically 4 (four) times daily.    . simvastatin (ZOCOR) 40 MG tablet Take 40 mg by mouth daily.    . valsartan (DIOVAN) 160 MG tablet Take 1 tablet (160 mg total) by mouth daily.  90 tablet 3   No current facility-administered medications for this visit.     Allergies-reviewed and updated Allergies  Allergen Reactions  . Lisinopril     Social History   Social History  . Marital status: Divorced    Spouse name: N/A  . Number of children: 3  . Years of education: N/A   Occupational History  . truck driver    Social History Main Topics  . Smoking status: Current Some Day Smoker    Types: Cigars  . Smokeless tobacco: Never Used  . Alcohol use 0.0 oz/week     Comment: occ  . Drug use: No  . Sexual activity: Not Asked   Other Topics Concern  . None   Social History Narrative   Married. 3 kids (14, 34- prior wife; 50 year old- current wife. ). 1 grandchild on way in 2018.    His family lives in [place]. His daughter lives in [place]. He lived in [place] for 20 years.       Works as Administrator   Objective: BP (!) 150/90 (BP Location: Left Arm, Patient Position: Sitting, Cuff Size: Large)   Pulse 90   Temp 98.1 F (36.7 C) (Oral)   Ht 5\' 9"  (1.753 m)   Wt 275 lb 12.8 oz (125.1 kg)   SpO2 97%  BMI 40.73 kg/m  Gen: NAD, resting comfortably HEENT: Mucous membranes are moist. Oropharynx normal. TM largely normal on right, on left patient with erythema and bulge of membrane Neck: no thyromegaly CV: RRR no murmurs rubs or gallops Lungs: CTAB no crackles, wheeze, rhonchi Abdomen: soft/nontender/nondistended/normal bowel sounds. No rebound or guarding. Morbid obesity Ext: no edema Skin: warm, dry Neuro: grossly normal, moves all extremities, PERRLA Rectal: normal tone, mild diffusely enlarged prostate, no masses or tenderness   Assessment/Plan:  50 y.o. male presenting for annual physical.  Health Maintenance counseling: 1. Anticipatory guidance: Patient counseled regarding regular dental exams, eye exams, wearing seatbelts.  2. Risk factor reduction:  Advised patient of need for regular exercise and diet rich and fruits and vegetables to reduce  risk of heart attack and stroke. Wants to get to 230. No regular exercise except active at work- advised to change 3. Immunizations/screenings/ancillary studies Immunization History  Administered Date(s) Administered  . Influenza Split 05/28/2011  . Td 01/15/2009   Health Maintenance Due  Topic Date Due  . Hepatitis C Screening - with labs today June 13, 1966   4. Prostate cancer screening- low risk rectal exam and PSA trend. Mild BPH.  Stable nocturia about twice a night Lab Results  Component Value Date   PSA 1.48 07/20/2016   PSA 1.20 01/31/2013   PSA 0.9 06/06/2004   5. Colon cancer screening - 11/21/13 with 5 year repeat  Acute and chronic concerns  Otitis media S: bilateral ear pain L>R for a week. Intermittent low grade fever he resports. Does use q tips A/P: cover left otitis media with amoxcillin. Outer canal irritated from q tip use and advised against this. Do not think true otitis externa but will monitor.   Sciatica S: has been going on for a month- 2 treatments with prednisone failed. Continued pain in left lower leg and some back pain as well A/P: refer to orthopedic surgery  Tobacco abuse- occasional cigar- advised against this  Hyperlipidemia S: poorly controlled on simvastatin 40mg . No myalgias.  Lab Results  Component Value Date   CHOL 211 (H) 07/20/2016   HDL 58.90 07/20/2016   LDLCALC 137 (H) 07/20/2016   LDLDIRECT 120.3 01/31/2013   TRIG 75.0 07/20/2016   CHOLHDL 4 07/20/2016   A/P: needed to change due to amlodipine anyway- changed to atorvastatin 40mg - will hope this improves LDL control with goal at least unde r100, 70 more ideal   Essential hypertension S: controlled poorly on amlodipine 5mg , valsartan 160mg  BP Readings from Last 3 Encounters:  07/20/16 (!) 150/90  06/09/16 (!) 138/96  11/11/15 (!) 140/100  A/P:Continue current meds:  But increase amlodipine to 10mg . Follow up in a few weeks. Suspect may need HCTZ or increase in valsartan as well  at follow up especially if pushing for ,130/80   HYPERGLYCEMIA Morbid obesity S:high increased risk of diabetes Lab Results  Component Value Date   HGBA1C 6.4 07/20/2016  A/P: discussed how important weight loss will be for him. He has lost 13 lbs over 8 months but is going to need much more. On his return visit- will discuss metformin option. Refer to nutrition to help him with weight loss and DM prevention.    Microscopic hematuria 07/20/16- no blood on microscopic. POC UA showed trace blood.   1 month BP follow up  Orders Placed This Encounter  Procedures  . CBC    [place]  . Comprehensive metabolic panel        Order Specific Question:  Has the patient fasted?    Answer:   No  . Lipid panel    [place]    Order Specific Question:   Has the patient fasted?    Answer:   No  . PSA  . Hemoglobin A1c    [place]  . Hepatitis C antibody, reflex    [facility]  . Urine Microscopic  . Ambulatory referral to Orthopedic Surgery    Referral Priority:   Routine    Referral Type:   Surgical    Referral Reason:   Specialty Services Required    Requested Specialty:   Orthopedic Surgery    Number of Visits Requested:   1  . Amb ref to Medical Nutrition Therapy-MNT    Referral Priority:   Routine    Referral Type:   Consultation    Referral Reason:   Specialty Services Required    Requested Specialty:   Nutrition    Number of Visits Requested:   1  . POCT Urinalysis Dipstick (Automated)    Meds ordered this encounter  Medications  . amLODipine (NORVASC) 10 MG tablet    Sig: Take 1 tablet (10 mg total) by mouth daily.    Dispense:  90 tablet    Refill:  3  . amoxicillin (AMOXIL) 500 MG capsule    Sig: Take 1 capsule (500 mg total) by mouth 2 (two) times daily.    Dispense:  14 capsule    Refill:  0  . atorvastatin (LIPITOR) 40 MG tablet    Sig: Take 1 tablet (40 mg total) by mouth daily.    Dispense:  90 tablet    Refill:  3    Return precautions advised.    Garret Reddish, MD

## 2016-07-20 NOTE — Patient Instructions (Addendum)
Increase amlodipine to 10mg , continue valsartan. Recheck 1 month  I have to change your cholesterol medicine due to your dose of amlodipien having an interaction with simvastatin- start atorvastatin 40mg  daily and stop the simvastatin  Amoxicillin for ear infection for a week- see me back if this does not clear up. Avoid using q tips.   We will call you within a week about your referral to orthopedics for the left leg pain. If you do not hear within 2 weeks, give [place] a call.   Refer to nutrition as well to help with weight loss- great job losing 13 lbs! We need to keep it up.   Labs before you leave

## 2016-07-20 NOTE — Assessment & Plan Note (Signed)
S: controlled poorly on amlodipine 5mg , valsartan 160mg  BP Readings from Last 3 Encounters:  07/20/16 (!) 150/90  06/09/16 (!) 138/96  11/11/15 (!) 140/100  A/P:Continue current meds:  But increase amlodipine to 10mg . Follow up in a few weeks. Suspect may need HCTZ or increase in valsartan as well at follow up especially if pushing for ,130/80

## 2016-07-20 NOTE — Assessment & Plan Note (Signed)
07/20/16- no blood on microscopic. POC UA showed trace blood.

## 2016-07-20 NOTE — Assessment & Plan Note (Addendum)
Morbid obesity S:high increased risk of diabetes Lab Results  Component Value Date   HGBA1C 6.4 07/20/2016  A/P: discussed how important weight loss will be for him. He has lost 13 lbs over 8 months but is going to need much more. On his return visit- will discuss metformin option. Refer to nutrition to help him with weight loss and DM prevention.

## 2016-07-21 ENCOUNTER — Telehealth: Payer: Self-pay

## 2016-07-21 LAB — HEPATITIS C ANTIBODY: HCV AB: NEGATIVE

## 2016-07-21 NOTE — Telephone Encounter (Signed)
[facility]. PA approved & form faxed back to pharmacy.

## 2016-08-14 ENCOUNTER — Ambulatory Visit (INDEPENDENT_AMBULATORY_CARE_PROVIDER_SITE_OTHER): Payer: PRIVATE HEALTH INSURANCE | Admitting: Orthopaedic Surgery

## 2016-09-04 ENCOUNTER — Ambulatory Visit (INDEPENDENT_AMBULATORY_CARE_PROVIDER_SITE_OTHER): Payer: PRIVATE HEALTH INSURANCE | Admitting: Orthopaedic Surgery

## 2016-09-07 ENCOUNTER — Encounter: Payer: BLUE CROSS/BLUE SHIELD | Attending: Family Medicine | Admitting: Registered"

## 2016-09-07 VITALS — Ht 69.0 in | Wt 271.6 lb

## 2016-09-07 DIAGNOSIS — IMO0001 Reserved for inherently not codable concepts without codable children: Secondary | ICD-10-CM

## 2016-09-07 DIAGNOSIS — R739 Hyperglycemia, unspecified: Secondary | ICD-10-CM | POA: Insufficient documentation

## 2016-09-07 DIAGNOSIS — Z713 Dietary counseling and surveillance: Secondary | ICD-10-CM | POA: Diagnosis present

## 2016-09-07 NOTE — Patient Instructions (Signed)
Goals:  Eat 3 meals/day, Avoid meal skipping   Include protein rich foods when eating carbohydrates  Follow "Plate Method" for portion control  Limit carbohydrate 2-3 servings/meal   Continue choosing whole grains, lean protein, low-fat dairy, and fruits/non-starchy vegetables.   Continue getting physical activity daily

## 2016-09-07 NOTE — Progress Notes (Signed)
Medical Nutrition Therapy:  Appt start time: 1430 end time:  [id number].   Assessment:  Primary concerns today: weight loss, hyperglycemia. 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. He is continuing to loose weight by eating less. He usually eats one meal per day. Encouraged to spread intake throughout the day to help with keeping BG levels where they should be.  Preferred Learning Style:  No preference indicated   DIETARY INTAKE:  Usual eating pattern includes 1 meals and 0-1 snacks per day.  24-hr recall:  B ( AM): coffee and splenda and cream  Snk ( AM):    L ( PM): none Snk ( PM): none D (5 PM): Rice, grilled chicken OR Fish (fried or gilled) green beans, legumes. 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   Nutritional Diagnosis:  [place]-3.3 Overweight/obesity As related to eating bulk of calories one meal per day.  As evidenced by dietary recall.    Intervention: Nutrition education for managing blood glucose with diet and lifestyle changes. Described diabetes. Defined the role of glucose and insulin.   Described the relationship between diabetes and cardiovascular risk.  Described the role of different macronutrients on glucose.  Explained how carbohydrates affect blood glucose.  Stated what foods contain the most carbohydrates.  Demonstrated carbohydrate counting.    Teaching Method Utilized: Visual Auditory Hands on  Handouts given during visit include:  My Plate  Yellow carb counting/meal planning card  Snack Sheet  Goals:  Eat 3 meals/day, Avoid meal skipping   Include protein rich foods when eating carbohydrates  Follow "Plate Method" for portion control  Limit carbohydrate 2-3 servings/meal   Continue choosing whole grains, lean  protein, low-fat dairy, and fruits/non-starchy vegetables.   Continue getting physical activity daily  Barriers to learning/adherence to lifestyle change: none  Demonstrated degree of understanding via:  Teach Back   Monitoring/Evaluation:  Dietary intake and timing of meals and body weight prn.

## 2016-09-21 ENCOUNTER — Ambulatory Visit (INDEPENDENT_AMBULATORY_CARE_PROVIDER_SITE_OTHER): Payer: PRIVATE HEALTH INSURANCE | Admitting: Orthopaedic Surgery

## 2016-10-05 ENCOUNTER — Other Ambulatory Visit (INDEPENDENT_AMBULATORY_CARE_PROVIDER_SITE_OTHER): Payer: Self-pay

## 2016-10-05 ENCOUNTER — Encounter (INDEPENDENT_AMBULATORY_CARE_PROVIDER_SITE_OTHER): Payer: Self-pay | Admitting: Orthopaedic Surgery

## 2016-10-05 ENCOUNTER — Ambulatory Visit (INDEPENDENT_AMBULATORY_CARE_PROVIDER_SITE_OTHER): Payer: BLUE CROSS/BLUE SHIELD | Admitting: Orthopaedic Surgery

## 2016-10-05 VITALS — BP 152/73 | HR 90 | Resp 14 | Ht 69.0 in | Wt 273.0 lb

## 2016-10-05 DIAGNOSIS — M5442 Lumbago with sciatica, left side: Secondary | ICD-10-CM | POA: Diagnosis not present

## 2016-10-05 DIAGNOSIS — G8929 Other chronic pain: Secondary | ICD-10-CM | POA: Diagnosis not present

## 2016-10-05 NOTE — Progress Notes (Signed)
Office Visit Note   Patient: [name]           Date of Birth: 1966-06-29           MRN: [id number] Visit Date: 10/05/2016              Requested by: Marin Olp, MD [place], [place] [id number] PCP: Garret Reddish, MD   Assessment & Plan: Visit Diagnoses: Low back pain associated with  occasional left lower extremity radiculopathy of 4 months duration. Has had an MRI scan through his primary care physician demonstrating moderate bilateral facet hypertrophy at L4-5 and L5-S1. There is no spinal canal stenosis but neuro foraminal stenosis evident at both levels  Plan: Physical therapy 1 for instructions on low back exercises, evaluation by Dr. Ernestina Patches for consideration of either an epidural steroid injection or left L4-5 and L5-S1 injections. Follow-up in 2 months. 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 Continue on Voltaren as needed  Orders:  No orders of the defined types were placed in this encounter.  No orders of the defined types were placed in this encounter.     Procedures: No procedures performed   Clinical Data: No additional findings. MRI scan formed November 2017 demonstrates moderate bilateral facet hypertrophy and a small disc bulge at L4-5.No spinal canal stenosis identified. Mild bilateral neural foraminal stenosis. At L5-S1 there was severe bilateral facet hypertrophy. No spinal canal stenosis. Moderate bilateral neural foraminal stenosis.  Subjective: No chief complaint on file.   Patient seen with slightly over one month history of left lumbar back pain radiating all the way to the foot at times. No known injury. Works as a Administrator. 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 Denies history of injury or trauma. He is on Voltaren which he notes  "helps". No right lower extremity radicular pain. Pain mostly in his low  back.  Review of Systems   Objective: Vital Signs: There were no vitals taken for this visit.  Physical Exam  Ortho Exam straight leg raise is negative bilaterally. Painless range of motion of both hips and both knees. Skin intact. Strength in both lower extremities normal. Very mild percussible tenderness along the lumbar spine more to the left than the right. No flank pain. No sensory changes today.  No specialty comments available.  Imaging: No results found.   PMFS History: Patient Active Problem List   Diagnosis Date Noted  . Morbid obesity ([place]) 07/20/2016  . Bilateral hand pain 09/17/2014  . Nocturia more than twice per night 04/03/2014  . Internal hemorrhoids 10/04/2013  . Headache(784.0) 08/02/2013  . Microscopic hematuria 05/13/2012  . HYPERGLYCEMIA 11/13/2009  . TOBACCO ABUSE 09/25/2008  . Hyperlipidemia 03/21/2007  . Essential hypertension 03/21/2007  . GERD 03/21/2007   Past Medical History:  Diagnosis Date  . Abnormal glucose   . Carbuncles   . Hyperlipidemia   . Hypertension   . Hypokalemia   . Obesity     Family History  Problem Relation Age of Onset  . Heart attack Mother     age 53  . Other Father     24- unknown cause    Past Surgical History:  Procedure Laterality Date  . none     Social History   Occupational History  . truck driver    Social History Main Topics  . Smoking status: Current Some Day Smoker  Types: Cigars  . Smokeless tobacco: Never Used  . Alcohol use 0.0 oz/week     Comment: occ  . Drug use: No  . Sexual activity: Not on file

## 2016-10-19 ENCOUNTER — Institutional Professional Consult (permissible substitution) (INDEPENDENT_AMBULATORY_CARE_PROVIDER_SITE_OTHER): Payer: BLUE CROSS/BLUE SHIELD | Admitting: Physical Medicine and Rehabilitation

## 2016-12-07 ENCOUNTER — Ambulatory Visit (INDEPENDENT_AMBULATORY_CARE_PROVIDER_SITE_OTHER): Payer: BLUE CROSS/BLUE SHIELD | Admitting: Orthopaedic Surgery

## 2016-12-14 ENCOUNTER — Ambulatory Visit (INDEPENDENT_AMBULATORY_CARE_PROVIDER_SITE_OTHER): Payer: BLUE CROSS/BLUE SHIELD | Admitting: Orthopaedic Surgery

## 2017-01-08 ENCOUNTER — Ambulatory Visit (INDEPENDENT_AMBULATORY_CARE_PROVIDER_SITE_OTHER): Payer: BLUE CROSS/BLUE SHIELD | Admitting: Family Medicine

## 2017-01-08 ENCOUNTER — Encounter: Payer: Self-pay | Admitting: Family Medicine

## 2017-01-08 ENCOUNTER — Telehealth: Payer: Self-pay | Admitting: Family Medicine

## 2017-01-08 ENCOUNTER — Other Ambulatory Visit: Payer: Self-pay

## 2017-01-08 VITALS — BP 132/78 | HR 86 | Temp 98.6°F | Ht 69.0 in | Wt 276.6 lb

## 2017-01-08 DIAGNOSIS — R1013 Epigastric pain: Secondary | ICD-10-CM

## 2017-01-08 LAB — COMPREHENSIVE METABOLIC PANEL
ALBUMIN: 4.7 g/dL (ref 3.5–5.2)
ALK PHOS: 72 U/L (ref 39–117)
ALT: 27 U/L (ref 0–53)
AST: 24 U/L (ref 0–37)
BUN: 16 mg/dL (ref 6–23)
CALCIUM: 9.9 mg/dL (ref 8.4–10.5)
CO2: 26 mEq/L (ref 19–32)
Chloride: 105 mEq/L (ref 96–112)
Creatinine, Ser: 0.98 mg/dL (ref 0.40–1.50)
GFR: 102.93 mL/min (ref >60.00)
Glucose, Bld: 138 mg/dL — ABNORMAL HIGH (ref 70–99)
POTASSIUM: 4.3 meq/L (ref 3.5–5.1)
Sodium: 139 mEq/L (ref 135–145)
TOTAL PROTEIN: 7.6 g/dL (ref 6.0–8.3)
Total Bilirubin: 0.5 mg/dL (ref 0.2–1.2)

## 2017-01-08 LAB — CBC WITH DIFFERENTIAL/PLATELET
BASOS PCT: 0.6 % (ref 0.0–3.0)
Basophils Absolute: 0 10*3/uL (ref 0.0–0.1)
EOS ABS: 0.2 10*3/uL (ref 0.0–0.7)
Eosinophils Relative: 4.3 % (ref 0.0–5.0)
HCT: 43.6 % (ref 39.0–52.0)
HEMOGLOBIN: 14.2 g/dL (ref 13.0–17.0)
Lymphocytes Relative: 35.1 % (ref 12.0–46.0)
Lymphs Abs: 1.9 10*3/uL (ref 0.7–4.0)
MCHC: 32.6 g/dL (ref 30.0–36.0)
MCV: 82.9 fl (ref 78.0–100.0)
MONO ABS: 0.4 10*3/uL (ref 0.1–1.0)
Monocytes Relative: 8 % (ref 3.0–12.0)
NEUTROS PCT: 52 % (ref 43.0–77.0)
Neutro Abs: 2.8 10*3/uL (ref 1.4–7.7)
Platelets: 285 10*3/uL (ref 150.0–400.0)
RBC: 5.26 Mil/uL (ref 4.22–5.81)
RDW: 13.7 % (ref 11.5–15.5)
WBC: 5.3 10*3/uL (ref 4.0–10.5)

## 2017-01-08 LAB — LIPASE: LIPASE: 28 U/L (ref 11.0–59.0)

## 2017-01-08 MED ORDER — OMEPRAZOLE 40 MG PO CPDR: 40.0000 mg | DELAYED_RELEASE_CAPSULE | Freq: Every day | ORAL | 1 refills | Status: DC

## 2017-01-08 MED ORDER — OMEPRAZOLE 40 MG PO CPDR: 40.0000 mg | DELAYED_RELEASE_CAPSULE | Freq: Every day | ORAL | 1 refills | Status: AC

## 2017-01-08 NOTE — Telephone Encounter (Signed)
° ° °  Pt [facility] the below [facility]  [place] . rx was sent to [facility] .  omeprazole (PRILOSEC) 40 MG capsule   [place] delete all pharmacie except the   [place]

## 2017-01-08 NOTE — Progress Notes (Signed)
Subjective:  [name] is a 51 y.o. year old very pleasant male patient who presents for/with See problem oriented charting ROS- no fever or chills. Has nausea. No vomiting. Has abdominal pain. No chest pain or shortness of breath.    Past Medical History-  Patient Active Problem List   Diagnosis Date Noted  . Morbid obesity ([place]) 07/20/2016    Priority: High  . Microscopic hematuria 05/13/2012    Priority: High  . TOBACCO ABUSE 09/25/2008    Priority: High  . Bilateral hand pain 09/17/2014    Priority: Medium  . Nocturia more than twice per night 04/03/2014    Priority: Medium  . HYPERGLYCEMIA 11/13/2009    Priority: Medium  . Hyperlipidemia 03/21/2007    Priority: Medium  . Essential hypertension 03/21/2007    Priority: Medium  . Internal hemorrhoids 10/04/2013    Priority: Low  . Headache(784.0) 08/02/2013    Priority: Low  . GERD 03/21/2007    Priority: Low    Medications- reviewed and updated Current Outpatient Prescriptions  Medication Sig Dispense Refill  . amLODipine (NORVASC) 10 MG tablet Take 1 tablet (10 mg total) by mouth daily. 90 tablet 3  . atorvastatin (LIPITOR) 40 MG tablet Take 1 tablet (40 mg total) by mouth daily. 90 tablet 3  . diclofenac sodium (VOLTAREN) 1 % GEL Apply 2 g topically 4 (four) times daily. 1 Tube 1  . valsartan (DIOVAN) 160 MG tablet Take 1 tablet (160 mg total) by mouth daily. 90 tablet 3   No current facility-administered medications for this visit.     Objective: BP 132/78 (BP Location: Left Arm, Patient Position: Sitting, Cuff Size: Large)   Pulse 86   Temp 98.6 F (37 C) (Oral)   Ht 5\' 9"  (1.753 m)   Wt 276 lb 9.6 oz (125.5 kg)   SpO2 96%   BMI 40.85 kg/m  Gen: NAD, resting comfortably and well appearing Mucous membranes are moist. CV: RRR no murmurs rubs or gallops Lungs: CTAB no crackles, wheeze, rhonchi Abdomen: very tender in epigastric area with deep palpation- seems to be similarly tender in upper areas of RUQ  and LUQ. nondistended/normal bowel sounds. No rebound or guarding.  Ext: no edema Skin: warm, dry, no rash over abdomen  Assessment/Plan:  Epigastric abdominal pain - Plan: Comprehensive metabolic panel, Lipase, CBC with Differential/Platelet, US Abdomen Limited RUQ S: epigastric pain for 2 weeks.   For at least a month was using apple cider vinegar with a lot of water- but was told to reduce water drastically and then pain started shortly thereafter. Only having pain with apple cidar vinegar at first then worsened. .   At least 1.5 weeks ago Stopped apple cidar vinegar. Started prilosec 1.5 weeks ago each AMand zantac 1.5 weeks ago before lunch.   States pain can be up to 7/10 pain. Worse with meals. Better further out from meals. Spicy foods makes it worse.   Some nausea about once a day. Eating gets rid of nausea but increases pain. No constipation. No urinary issues.   Denies use of nsaids. Does state he has had an ulcer before A/P: I am concerned that this could be an ulcer (epigastric pain constant but worse after meals.  -Increase prilosec to 40mg .Can continue zantac before lunch or dinner - rule out pancreatic issues - Rule out pancreas issues with lipase - Get ultrasound to rule out gallstones.  -I may change to Ct of your abdomen depending on bloodwork- such as if leukocytosis with  left shift  3-4 week follow up but directed him on emergent and urgent return precautions such as fever or worsening pain  Orders Placed This Encounter  Procedures  . US Abdomen Limited RUQ    Standing Status:   Future    Standing Expiration Date:   03/10/2018    Order Specific Question:   Reason for Exam (SYMPTOM  OR DIAGNOSIS REQUIRED)    Answer:   epigastric abdominal pain. possible gastric ulcer, rule out gallstones    Order Specific Question:   Preferred imaging location?    Answer:   [facility]  . Comprehensive metabolic panel    [place]  . Lipase  . CBC with  Differential/Platelet    Meds ordered this encounter  Medications  . omeprazole (PRILOSEC) 40 MG capsule    Sig: Take 1 capsule (40 mg total) by mouth daily.    Dispense:  30 capsule    Refill:  1    Return precautions advised.  Garret Reddish, MD

## 2017-01-08 NOTE — Patient Instructions (Signed)
I am worried that this could be an ulcer Increase prilosec to 40mg  (I sent this in for you). Can continue zantac before lunch or dinner  Rule out pancreas issues with blood test.   Get ultrasound to rule out gallstones.   I may change to Ct of your abdomen depending on bloodwork

## 2017-01-08 NOTE — Telephone Encounter (Signed)
Rx has been sent in to preferred pharmacy.  

## 2017-01-27 ENCOUNTER — Other Ambulatory Visit: Payer: Self-pay | Admitting: Family Medicine

## 2018-02-20 IMAGING — MR MR LUMBAR SPINE W/O CM
4 of 5 series · 26 of 48 positions shown · non-contrast
Comparison: None.

CLINICAL DATA: Low back pain and left foot numbness

EXAM:
MRI LUMBAR SPINE WITHOUT CONTRAST
TECHNIQUE: Multiplanar, multisequence MR imaging of the lumbar spine was
performed. No intravenous contrast was administered.

[Series 4: T1 · sagittal · 4.0mm · 0.59mm/px · 6 of 13 slices shown (1 of 2)]
[im 1/13]
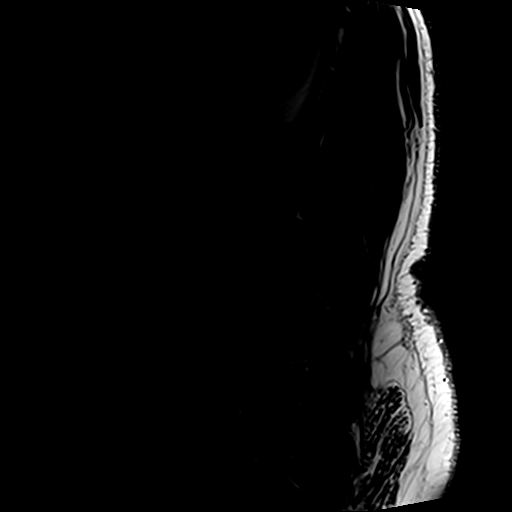
[im 3/13]
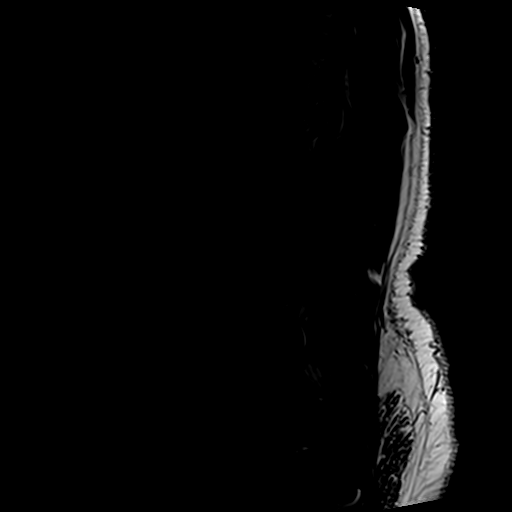
[im 5/13]
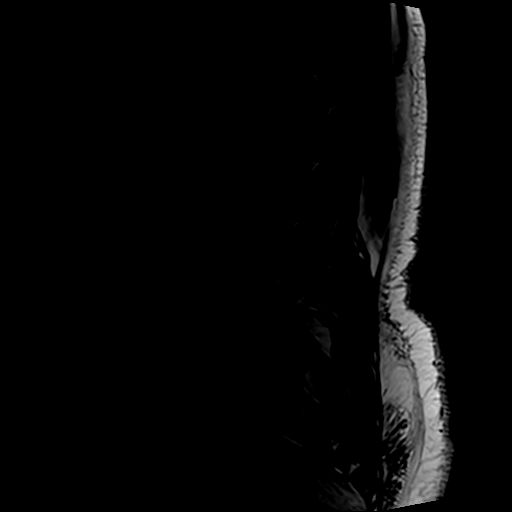
[im 8/13]
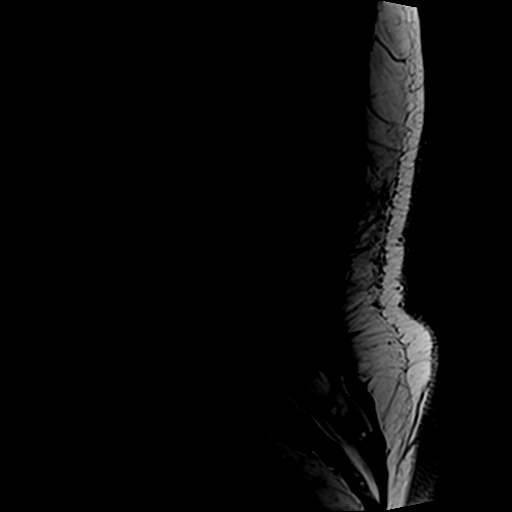
[im 10/13]
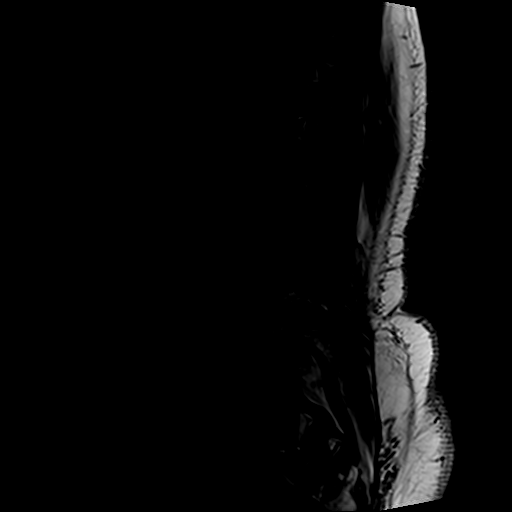
[im 13/13]
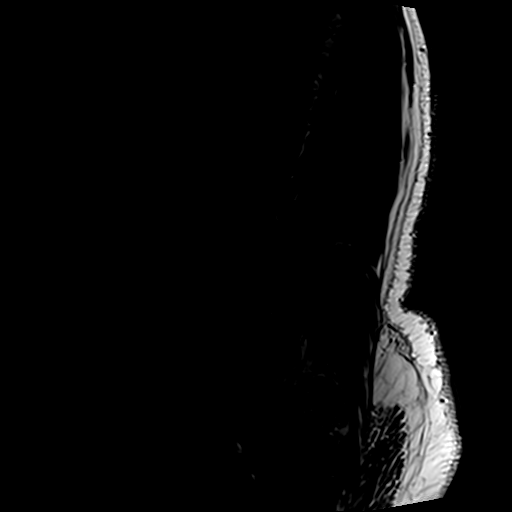

[Series 5: T2 post-contrast · sagittal · 4.0mm · 0.59mm/px · 6 of 13 slices shown]
[im 1/13]
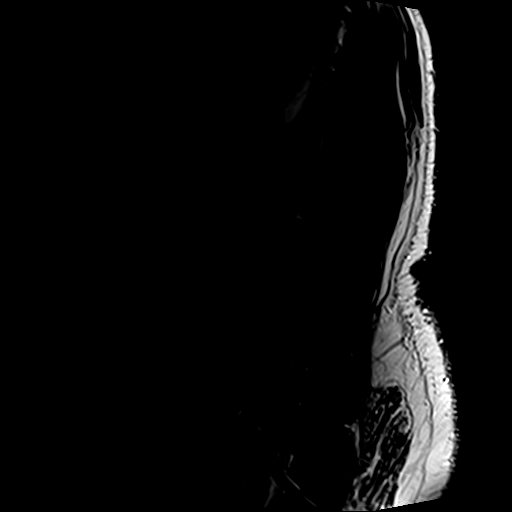
[im 3/13]
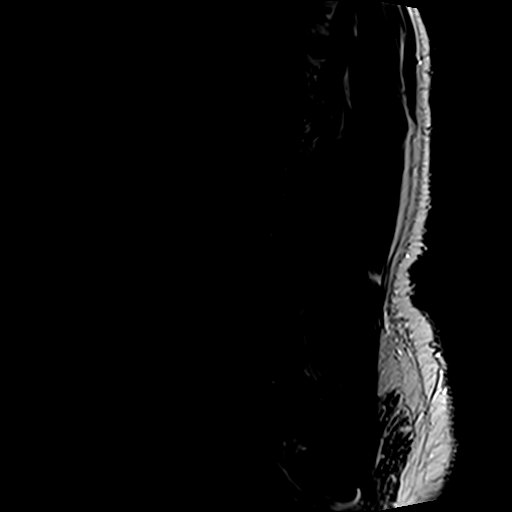
[im 5/13]
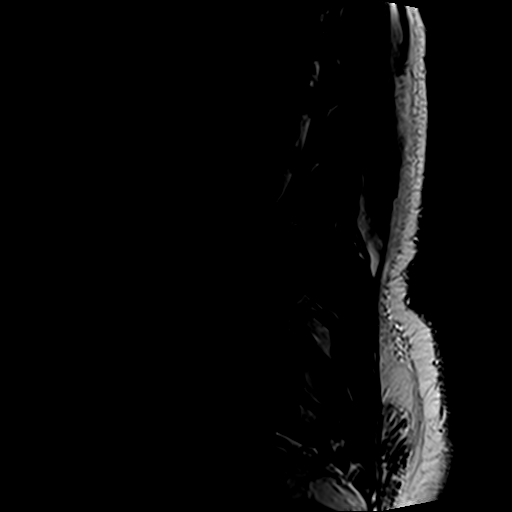
[im 8/13]
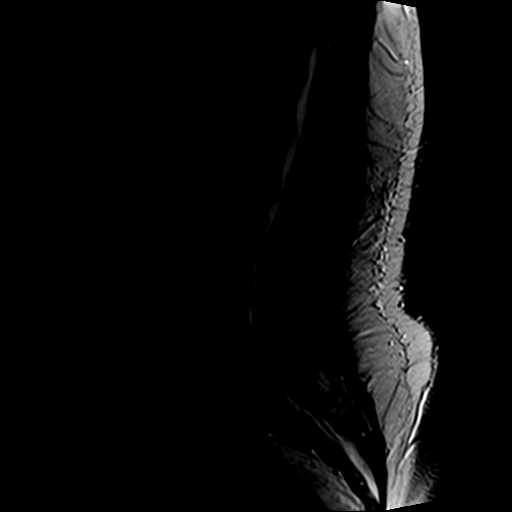
[im 10/13]
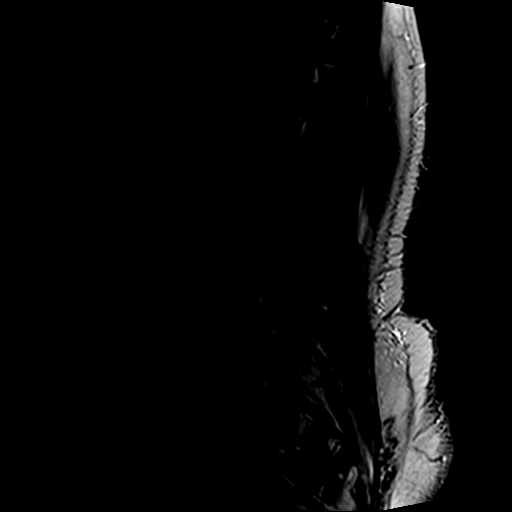
[im 13/13]
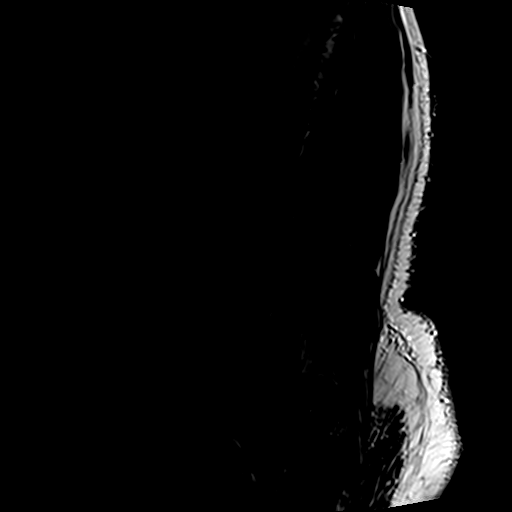

[Series 6: T2 · axial · 4.0mm · 0.70mm/px · z∈[-83,+109]mm · 9 of 35 slices shown]
[im 1/35]
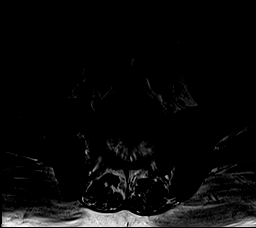
[im 5/35]
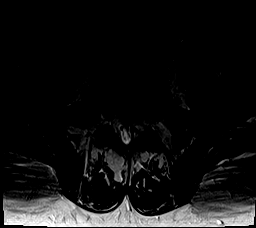
[im 10/35]
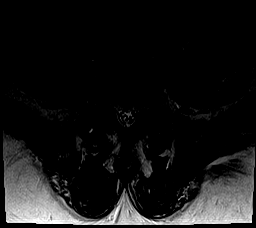
[im 15/35]
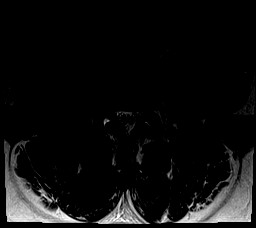
[im 18/35]
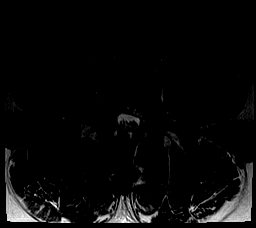
[im 20/35]
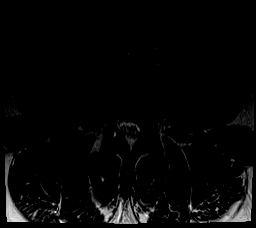
[im 25/35]
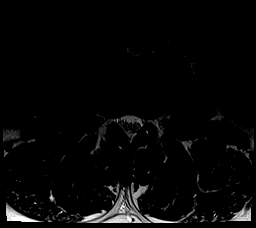
[im 30/35]
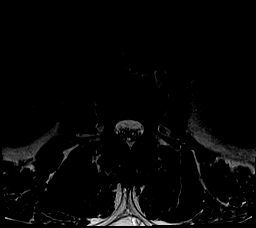
[im 35/35]
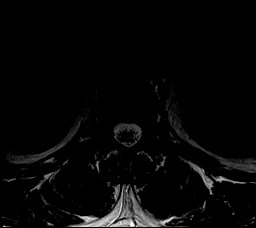

[Series 7: T1 · axial · 4.0mm · 0.35mm/px · z∈[-83,+84]mm · 5 of 35 slices shown (2 of 2)]
[im 1/35]
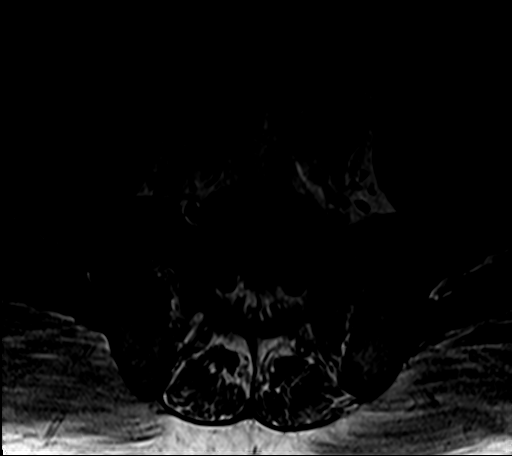
[im 5/35]
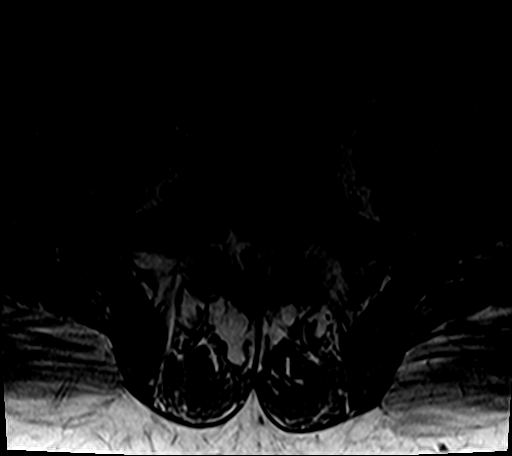
[im 10/35]
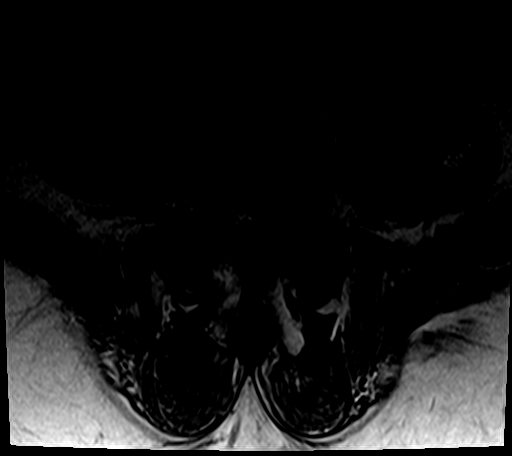
[im 18/35]
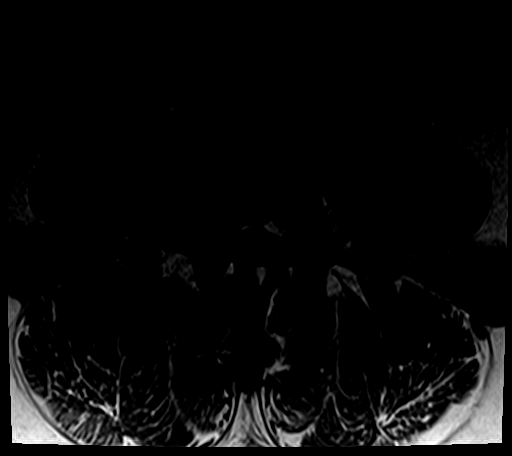
[im 30/35]
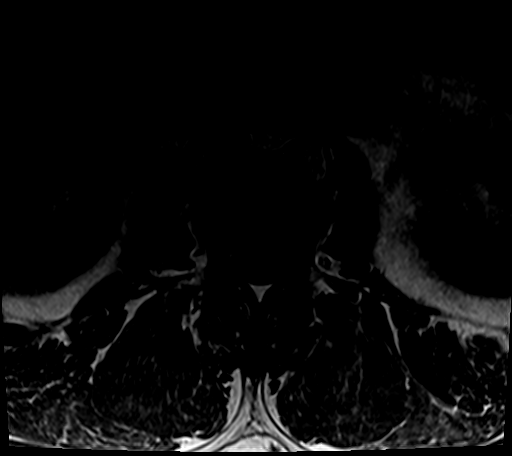

[26 of 48 positions shown; findings below may reference images not displayed]

FINDINGS: Segmentation: The lowest fully formed disc space is considered to be
L5-S1. There is a rudimentary disc at S1-S2.

Alignment:  There is grade 1 retrolisthesis at L2-L3 and L3-L4.

Vertebrae:  No fracture, evidence of discitis, or bone lesion.

Conus medullaris: Extends to the L2 level and appears normal.

Paraspinal and other soft tissues: Negative.

Disc levels:

T12-L1: Normal disc space and facet joints. No spinal canal
stenosis. No neuroforaminal stenosis.

L1-L2: Mild narrowing of the intervertebral disc space without
significant bulge. No spinal canal stenosis. No neuroforaminal
stenosis.

L2-L3: Normal disc space and facet joints. No spinal canal stenosis.
No neuroforaminal stenosis.

L3-L4: Left inferior endplate osteophyte. Minimal disc bulge. Mild
facet hypertrophy. No spinal canal stenosis. Mild left
neuroforaminal stenosis.

L4-L5: Moderate bilateral facet hypertrophy. Small disc bulge. No
spinal canal stenosis. Mild bilateral neuroforaminal stenosis.

L5-S1: Severe bilateral facet hypertrophy. No spinal canal stenosis.
Moderate bilateral neuroforaminal stenosis.
IMPRESSION: 1. Moderate L5-S1 and mild L4-L5 bilateral neural foraminal
stenosis, predominantly caused by advanced facet arthrosis.
2. Facet hypertrophy, disc bulge and endplate osteophyte at L3-L4
resulting in mild left foraminal stenosis.

## 2018-03-18 NOTE — Progress Notes (Signed)
Thanks Dr. Yong Channel, this is complete.

## 2018-06-23 ENCOUNTER — Telehealth: Payer: Self-pay

## 2018-06-23 NOTE — Telephone Encounter (Signed)
Called and spoke with patient who states he moved to [place] last year. He will not be returning to [facility]

## 2018-06-23 NOTE — Telephone Encounter (Signed)
-----   Message from Marin Olp, MD sent at 03/15/2018 12:20 PM EDT ----- [place] team, patient last seen a year ago- needs follow up with [place]. He never had his ultrasound either   Garret Reddish  ----- Message ----- From: SYSTEM Sent: 03/15/2018  12:07 AM To: Marin Olp, MD

## 2018-11-18 ENCOUNTER — Encounter: Payer: Self-pay | Admitting: Gastroenterology
